# Patient Record
Sex: Male | Born: 1945
Health system: Southern US, Community
[De-identification: ages and names within clinical notes are randomized; demographics above are authoritative.]

## PROBLEM LIST (undated history)

## (undated) DIAGNOSIS — I2510 Atherosclerotic heart disease of native coronary artery without angina pectoris: Secondary | ICD-10-CM

## (undated) DIAGNOSIS — E785 Hyperlipidemia, unspecified: Secondary | ICD-10-CM

## (undated) DIAGNOSIS — I219 Acute myocardial infarction, unspecified: Secondary | ICD-10-CM

## (undated) DIAGNOSIS — R079 Chest pain, unspecified: Secondary | ICD-10-CM

## (undated) DIAGNOSIS — M1990 Unspecified osteoarthritis, unspecified site: Secondary | ICD-10-CM

## (undated) DIAGNOSIS — I10 Essential (primary) hypertension: Secondary | ICD-10-CM

## (undated) DIAGNOSIS — F17200 Nicotine dependence, unspecified, uncomplicated: Secondary | ICD-10-CM

## (undated) DIAGNOSIS — E119 Type 2 diabetes mellitus without complications: Secondary | ICD-10-CM

## (undated) HISTORY — DX: Chest pain, unspecified: R07.9

## (undated) HISTORY — DX: Nicotine dependence, unspecified, uncomplicated: F17.200

## (undated) HISTORY — PX: OTHER SURGICAL HISTORY: SHX169

## (undated) HISTORY — DX: Hyperlipidemia, unspecified: E78.5

## (undated) HISTORY — PX: CATARACT EXTRACTION: SUR2

## (undated) HISTORY — PX: HEMORROIDECTOMY: SUR656

## (undated) HISTORY — DX: Atherosclerotic heart disease of native coronary artery without angina pectoris: I25.10

## (undated) HISTORY — PX: CORONARY ANGIOPLASTY: SHX604

---

## 1997-11-16 ENCOUNTER — Ambulatory Visit (HOSPITAL_COMMUNITY): Admission: RE | Admit: 1997-11-16 | Discharge: 1997-11-16 | Payer: Self-pay | Admitting: Orthopedic Surgery

## 2001-04-08 ENCOUNTER — Ambulatory Visit (HOSPITAL_COMMUNITY): Admission: RE | Admit: 2001-04-08 | Discharge: 2001-04-08 | Payer: Self-pay | Admitting: Family Medicine

## 2001-04-08 ENCOUNTER — Encounter: Payer: Self-pay | Admitting: Family Medicine

## 2001-08-06 ENCOUNTER — Other Ambulatory Visit: Admission: RE | Admit: 2001-08-06 | Discharge: 2001-08-06 | Payer: Self-pay | Admitting: Dermatology

## 2002-11-12 ENCOUNTER — Encounter: Payer: Self-pay | Admitting: Family Medicine

## 2002-11-12 ENCOUNTER — Ambulatory Visit (HOSPITAL_COMMUNITY): Admission: RE | Admit: 2002-11-12 | Discharge: 2002-11-12 | Payer: Self-pay | Admitting: Family Medicine

## 2005-05-03 ENCOUNTER — Ambulatory Visit (HOSPITAL_COMMUNITY): Admission: RE | Admit: 2005-05-03 | Discharge: 2005-05-03 | Payer: Self-pay | Admitting: Family Medicine

## 2008-12-07 ENCOUNTER — Encounter: Payer: Self-pay | Admitting: Emergency Medicine

## 2008-12-07 ENCOUNTER — Inpatient Hospital Stay (HOSPITAL_COMMUNITY): Admission: EM | Admit: 2008-12-07 | Discharge: 2008-12-09 | Payer: Self-pay | Admitting: Cardiology

## 2008-12-07 ENCOUNTER — Ambulatory Visit: Payer: Self-pay | Admitting: Cardiology

## 2008-12-24 DIAGNOSIS — R079 Chest pain, unspecified: Secondary | ICD-10-CM

## 2008-12-24 DIAGNOSIS — F17200 Nicotine dependence, unspecified, uncomplicated: Secondary | ICD-10-CM

## 2008-12-24 DIAGNOSIS — E785 Hyperlipidemia, unspecified: Secondary | ICD-10-CM

## 2008-12-24 HISTORY — DX: Chest pain, unspecified: R07.9

## 2008-12-24 HISTORY — DX: Hyperlipidemia, unspecified: E78.5

## 2008-12-24 HISTORY — DX: Nicotine dependence, unspecified, uncomplicated: F17.200

## 2008-12-29 ENCOUNTER — Ambulatory Visit: Payer: Self-pay | Admitting: Cardiovascular Disease

## 2008-12-29 DIAGNOSIS — I2510 Atherosclerotic heart disease of native coronary artery without angina pectoris: Secondary | ICD-10-CM

## 2008-12-29 HISTORY — DX: Atherosclerotic heart disease of native coronary artery without angina pectoris: I25.10

## 2009-01-05 ENCOUNTER — Telehealth: Payer: Self-pay | Admitting: Cardiovascular Disease

## 2009-01-09 ENCOUNTER — Telehealth: Payer: Self-pay | Admitting: Cardiovascular Disease

## 2009-01-10 ENCOUNTER — Telehealth: Payer: Self-pay | Admitting: Cardiovascular Disease

## 2009-01-16 ENCOUNTER — Telehealth (INDEPENDENT_AMBULATORY_CARE_PROVIDER_SITE_OTHER): Payer: Self-pay | Admitting: *Deleted

## 2009-01-26 ENCOUNTER — Telehealth: Payer: Self-pay | Admitting: Cardiovascular Disease

## 2009-01-26 ENCOUNTER — Encounter: Payer: Self-pay | Admitting: Cardiovascular Disease

## 2009-03-23 ENCOUNTER — Ambulatory Visit: Payer: Self-pay | Admitting: Cardiovascular Disease

## 2009-03-23 ENCOUNTER — Telehealth: Payer: Self-pay | Admitting: Cardiovascular Disease

## 2009-05-17 ENCOUNTER — Encounter: Payer: Self-pay | Admitting: Cardiovascular Disease

## 2009-05-24 LAB — CONVERTED CEMR LAB
ALT: 20 units/L (ref 0–53)
AST: 17 units/L (ref 0–37)
Albumin: 4.3 g/dL (ref 3.5–5.2)
Alkaline Phosphatase: 51 units/L (ref 39–117)
Bilirubin, Direct: 0.1 mg/dL (ref 0.0–0.3)
Cholesterol: 106 mg/dL (ref 0–200)
HDL: 26 mg/dL — ABNORMAL LOW (ref >39)
Indirect Bilirubin: 0.4 mg/dL (ref 0.0–0.9)
LDL Cholesterol: 58 mg/dL (ref 0–99)
Total Bilirubin: 0.5 mg/dL (ref 0.3–1.2)
Total CHOL/HDL Ratio: 4.1
Total Protein: 6.8 g/dL (ref 6.0–8.3)
Triglycerides: 111 mg/dL (ref <150)
VLDL: 22 mg/dL (ref 0–40)

## 2009-08-08 ENCOUNTER — Telehealth: Payer: Self-pay | Admitting: Cardiovascular Disease

## 2009-09-06 ENCOUNTER — Telehealth: Payer: Self-pay | Admitting: Cardiovascular Disease

## 2009-09-26 ENCOUNTER — Ambulatory Visit: Payer: Self-pay | Admitting: Cardiovascular Disease

## 2009-10-10 ENCOUNTER — Telehealth: Payer: Self-pay | Admitting: Cardiovascular Disease

## 2009-10-17 ENCOUNTER — Ambulatory Visit (HOSPITAL_COMMUNITY): Admission: RE | Admit: 2009-10-17 | Discharge: 2009-10-17 | Payer: Self-pay | Admitting: Cardiovascular Disease

## 2009-10-17 ENCOUNTER — Ambulatory Visit: Payer: Self-pay | Admitting: Cardiology

## 2009-10-17 ENCOUNTER — Ambulatory Visit: Payer: Self-pay

## 2009-10-26 ENCOUNTER — Telehealth: Payer: Self-pay | Admitting: Cardiovascular Disease

## 2009-11-23 ENCOUNTER — Telehealth: Payer: Self-pay | Admitting: Cardiovascular Disease

## 2010-03-20 ENCOUNTER — Telehealth: Payer: Self-pay | Admitting: Cardiovascular Disease

## 2010-03-27 ENCOUNTER — Ambulatory Visit: Payer: Self-pay | Admitting: Cardiovascular Disease

## 2010-05-22 NOTE — Progress Notes (Signed)
Summary: muscle pain  Phone Note Call from Patient Call back at Home Phone [phone]   Caller: Patient Reason for Call: Talk to Nurse Summary of Call: c/o muscule pain, pt think it related to the meds. plavix, crestor,  Initial call taken by: Lorne Skeens,  Sep 06, 2009 2:16 PM  Follow-up for Phone Call        Pt. reports gradual increase of pain starting in back of neck and going down both arms to bicep area. Increased yesterday. Occasional right sided chest pain.  Pain relieved some with Advil. I told pt it was better to take tylenol instead of advil and he will try this. No SOB. Pt reports  these pains are completely different than pain prior to stent. Still has pain in right wrist/finger as noted in note from last office visit. Pt. wondering if this could be related to meds.  I told him it would not be related to Plavix but muscle aches could be related to Crestor. I instructed pt to stop Crestor until scheduled office visit with Dr.Zawadi Aplin on September 21, 2009.  He will call primary care MD if pain was to increase. Dossie Arbour, RN, BSN  Sep 06, 2009 2:48 PM   Additional Follow-up for Phone Call Additional follow up Details #1::        I agree. cdm Additional Follow-up by: Verne Carrow, MD,  Sep 06, 2009 4:09 PM

## 2010-05-22 NOTE — Progress Notes (Signed)
Summary: med question  Phone Note Call from Patient Call back at Home Phone [phone]   Caller: Patient Reason for Call: Talk to Nurse Summary of Call: request to speak to nurse about meds Initial call taken by: Migdalia Dk,  October 10, 2009 3:52 PM  Follow-up for Phone Call        [name] calls today still having neck and arm discomfort with the reduced dose of Crestor 10mg .  He is taking this at bedtime.  He has stopped the Crestor and his symptoms have subsided.  He would like to know what other medication should he be taking for his cholesterol?  I told him Dr. Sanjuana Kava was out of the office at this time and I would forward this message to him.  He will await callback.   Lisabeth Devoid RN     Appended Document: med question He can stop Crestor. For now, he should stay off of a statin. cdm  Appended Document: med question left message to call back.  Appended Document: med question Pt. notified.

## 2010-05-22 NOTE — Assessment & Plan Note (Signed)
Summary: 6 month follow up/414.01/pla   Visit Type:  6 months follow up Primary Provider:  Dr. Timoteo Expose  CC:  dizziness sometimes and  Right shoulder pain .  History of Present Illness: 65 yo WM with h/o CAD, hyperlipidemia, tobacco abuse with  admission to [facility] 12/08/08 with NSTEMI. He was found to have a severe stenosis of the mid Circumflex coronary artery. This was treated with a large bare metal stent. He had moderate residual disease in the mid RCA.  He has had no chest pressure. He was on Crestor 40 mg but had bilateral shoulder pain with radiation into right arm. He stopped the Crestor three weeks ago and the pain has mostly resolved. He does describe occasional sharp right sided chest pains that shoot into his right shoulder.Nothing like his cardiac pain.  No exertional chest pain. No SOB. He has completely stopped smoking 10 months ago but has started back smoking 1-2 cigarettes per week. Exercise tolerance has been good. He walks every other day. Energy level is down a little. He gets fatigued easily.   Current Medications (verified): 1)  Nitroglycerin 0.4 Mg Subl (Nitroglycerin) .... One Tablet Under Tongue Every 5 Minutes As Needed For Chest Pain---May Repeat Times Three 2)  Metoprolol Tartrate 25 Mg Tabs (Metoprolol Tartrate) .... 1/2  Tab Two Times A Day 3)  Plavix 75 Mg Tabs (Clopidogrel Bisulfate) .Marland Kitchen.. 1 Tab Once Daily 4)  Tylenol 325 Mg Tabs (Acetaminophen) .... As Needed 5)  Aspirin Ec 325 Mg Tbec (Aspirin) .... Take One Tablet By Mouth Daily 6)  Fish Oil 1000 Mg Caps (Omega-3 Fatty Acids) .Marland Kitchen.. 1 Cap Once Daily  Allergies (verified): No Known Drug Allergies  Past History:  Past Medical History: Reviewed history from 12/29/2008 and no changes required. CORONARY ARTERY DISEASE-s/p bare metal stent mid Circumflex 8/10 CHEST PAIN-UNSPECIFIED (ICD-786.50) DYSLIPIDEMIA (ICD-272.4) TOBACCO USER (ICD-305.1)    Social History: Reviewed history from 12/29/2008 and  no changes required.  Occasional alcohol.  No illicit drug use.  He works as   a Production designer, theatre/television/film for a [employer].  History of tobacco use since the   age of 16-stopped smoking 8/10.  Review of Systems       The patient complains of joint pain.  The patient denies fatigue, malaise, fever, weight gain/loss, vision loss, decreased hearing, hoarseness, chest pain, palpitations, shortness of breath, prolonged cough, wheezing, sleep apnea, coughing up blood, abdominal pain, blood in stool, nausea, vomiting, diarrhea, heartburn, incontinence, blood in urine, muscle weakness, leg swelling, rash, skin lesions, headache, fainting, dizziness, depression, anxiety, enlarged lymph nodes, easy bruising or bleeding, and environmental allergies.    Vital Signs:  Patient profile:   65 year old male Height:      69 inches Weight:      193.50 pounds BMI:     28.68 Pulse rate:   74 / minute Pulse rhythm:   regular Resp:     18 per minute BP sitting:   120 / 84  (left arm) Cuff size:   large  Vitals Entered By: Vikki Ports (September 26, 2009 11:37 AM)  Physical Exam  General:  General: Well developed, well nourished, NAD Neuro: No focal deficits Psychiatric: Mood and affect normal Neck: No JVD, no carotid bruits, no thyromegaly, no lymphadenopathy. Lungs:Clear bilaterally, no wheezes, rhonci, crackles CV: RRR no murmurs, gallops rubs Abdomen: soft, NT, ND, BS present Extremities: No edema, pulses 2+.    EKG  Procedure date:  09/26/2009  Findings:  NSR, rate 74 bpm. RBBB.   Impression & Recommendations:  Problem # 1:  CAD, NATIVE VESSEL (ICD-414.01) Stable. Continue ASA/:Plavix/ beta blocker.  He will need at least one year of dual antiplatelet therapy. I am not sure if his muscle aches are related to the statin. Will start Crestor at lower dose of 10 mg per day. Will check lipids, LFTs and CK in 2 weeks. Will check echo to assess LV function with recent fatigue and dyspnea.   His  updated medication list for this problem includes:    Nitroglycerin 0.4 Mg Subl (Nitroglycerin) ..... One tablet under tongue every 5 minutes as needed for chest pain---may repeat times three    Metoprolol Tartrate 25 Mg Tabs (Metoprolol tartrate) .Marland Kitchen... 1/2  tab two times a day    Plavix 75 Mg Tabs (Clopidogrel bisulfate) .Marland Kitchen... 1 tab once daily    Aspirin Ec 325 Mg Tbec (Aspirin) .Marland Kitchen... Take one tablet by mouth daily  Orders: EKG w/ Interpretation (93000) Echocardiogram (Echo)  His updated medication list for this problem includes:    Nitroglycerin 0.4 Mg Subl (Nitroglycerin) ..... One tablet under tongue every 5 minutes as needed for chest pain---may repeat times three    Metoprolol Tartrate 25 Mg Tabs (Metoprolol tartrate) .Marland Kitchen... 1/2  tab two times a day    Plavix 75 Mg Tabs (Clopidogrel bisulfate) .Marland Kitchen... 1 tab once daily    Aspirin Ec 325 Mg Tbec (Aspirin) .Marland Kitchen... Take one tablet by mouth daily  Problem # 2:  TOBACCO USER (ICD-305.1) Complete cessation advised.   Patient Instructions: 1)  Your physician recommends that you schedule a follow-up appointment in: 6 months 2)  Your physician has recommended you make the following change in your medication: Start Crestor 10 mg by mouth daily 3)  Have lab work (lipid profile, liver profile, total CK) done in 2 weeks. You have prescription for this. 4)  Your physician has requested that you have an echocardiogram.  Echocardiography is a painless test that uses sound waves to create images of your heart. It provides your doctor with information about the size and shape of your heart and how well your heart's chambers and valves are working.  This procedure takes approximately one hour. There are no restrictions for this procedure.

## 2010-05-22 NOTE — Progress Notes (Signed)
Summary: pt has to have dental work done  Phone Note Call from Patient Call back at [employer] [phone]   Caller: Patient Reason for Call: Talk to Nurse, Talk to Doctor Summary of Call: pt has to have dental work to get done and is on plavix and needs to know if he needs to come off or not Initial call taken by: Omer Jack,  October 26, 2009 10:36 AM  Follow-up for Phone Call        Spoke with pt. who thinks he may need to have 2-3 teeth pulled. Has not seen dentist yet but is planning on going to free dental clinic in [place] that is planned for tomorrow and Saturday.  He is asking what to do about Plavix.  Will discuss with Dr. Clifton James Follow-up by: Dossie Arbour, RN, BSN,  October 26, 2009 10:59 AM  Additional Follow-up for Phone Call Additional follow up Details #1::        He needs Plavix until the end of August. I would not recommend stopping for elective dental work until then. cdm Additional Follow-up by: Verne Carrow, MD,  October 26, 2009 1:17 PM    Additional Follow-up for Phone Call Additional follow up Details #2::    Pt. given above information from Dr. Clifton James. Pt planning on going to dental clinic to be evaluated and see if tooth removal could be planned for a later date if needed. Follow-up by: Dossie Arbour, RN, BSN,  October 26, 2009 1:52 PM

## 2010-05-22 NOTE — Assessment & Plan Note (Signed)
Summary: per check out/sf   Visit Type:  6 MO F/U Primary Travez Stancil:  Dr. Regino Schultze  CC:  sob only when he has indigestion....says he has chest discomfort only when he has the indigsestion and he says he knows this is indigestion because he says it backs up in his esophagaus....denies an edema.  History of Present Illness: 65 yo WM with h/o CAD, hyperlipidemia, tobacco abuse with admission to [facility] 12/08/08 with NSTEMI. He was found to have a severe stenosis of the mid Circumflex coronary artery. This was treated with a large bare metal stent. He had moderate residual disease in the mid RCA.  He has had no chest pressure. He was on Crestor 40 mg but had bilateral shoulder pain with radiation into right arm. He stopped the Crestor prior to the last visit  and the pain has mostly resolved. Most recent echo in June 2011 with preserved LV function.   He has been doing well. We stopped his Plavix last week. He describes burning in his chest after meals which is c/w GERD. This has been better since he stopped his Plavix. No SOB or palpitations. No dizziness or near syncope. He has unfortunately started back smoking 2-3 cigarettes per week. He has been hunting or working without any problems. He does describe feeling more fatigued at the end of the day than he did in prior to his MI.   Current Medications (verified): 1)  Nitroglycerin 0.4 Mg Subl (Nitroglycerin) .... One Tablet Under Tongue Every 5 Minutes As Needed For Chest Pain---May Repeat Times Three 2)  Metoprolol Tartrate 25 Mg Tabs (Metoprolol Tartrate) .... 1/2  Tab Two Times A Day 3)  Tylenol 325 Mg Tabs (Acetaminophen) .... As Needed 4)  Fish Oil 1000 Mg Caps (Omega-3 Fatty Acids) .Marland Kitchen.. 1 Cap Once Daily  Allergies (verified): No Known Drug Allergies  Past History:  Past Medical History: Reviewed history from 12/29/2008 and no changes required. CORONARY ARTERY DISEASE-s/p bare metal stent mid Circumflex 8/10 CHEST  PAIN-UNSPECIFIED (ICD-786.50) DYSLIPIDEMIA (ICD-272.4) TOBACCO USER (ICD-305.1)    Social History: Reviewed history from 12/29/2008 and no changes required.  Occasional alcohol.  No illicit drug use.  He works as   a Production designer, theatre/television/film for a Database administrator.  History of tobacco use since the  age of 16-now 2-3 cigarettes per week.  he enjoyes hunting and playing guitar in a band  Review of Systems  The patient denies fatigue, malaise, fever, weight gain/loss, vision loss, decreased hearing, hoarseness, chest pain, palpitations, shortness of breath, prolonged cough, wheezing, sleep apnea, coughing up blood, abdominal pain, blood in stool, nausea, vomiting, diarrhea, heartburn, incontinence, blood in urine, muscle weakness, joint pain, leg swelling, rash, skin lesions, headache, fainting, dizziness, depression, anxiety, enlarged lymph nodes, easy bruising or bleeding, and environmental allergies.         Heartburn.  Vital Signs:  Patient profile:   65 year old male Height:      69 inches Weight:      194.50 pounds BMI:     28.83 Pulse rate:   78 / minute Pulse rhythm:   regular BP sitting:   130 / 88  (left arm) Cuff size:   large  Vitals Entered By: Danielle Rankin, CMA (March 27, 2010 11:23 AM)  Physical Exam  General:  General: Well developed, well nourished, NAD Musculoskeletal: Muscle strength 5/5 all ext Psychiatric: Mood and affect normal Neck: No JVD, no carotid bruits, no thyromegaly, no lymphadenopathy. Lungs:Clear bilaterally, no wheezes, rhonci,  crackles CV: RRR no murmurs, gallops rubs Abdomen: soft, NT, ND, BS present Extremities: No edema, pulses 2+.    Echocardiogram  Procedure date:  04/18/2010  Findings:      Left ventricle: The cavity size was normal. Wall thickness was     normal. The estimated ejection fraction was 60%. Wall motion was     normal; there were no regional wall motion abnormalities.  Impression & Recommendations:  Problem # 1:  CAD,  NATIVE VESSEL (ICD-414.01) Stable. Will have him restart ASA 81 mg by mouth Qdaily. He stopped this by mistake. He will stay off of Plavix. Continue Lopressor. Restart statin (see below).  The following medications were removed from the medication list:    Plavix 75 Mg Tabs (Clopidogrel bisulfate) .Marland Kitchen... 1 tab once daily    Aspirin Ec 325 Mg Tbec (Aspirin) .Marland Kitchen... Take one tablet by mouth daily His updated medication list for this problem includes:    Nitroglycerin 0.4 Mg Subl (Nitroglycerin) ..... One tablet under tongue every 5 minutes as needed for chest pain---may repeat times three    Metoprolol Tartrate 25 Mg Tabs (Metoprolol tartrate) .Marland Kitchen... 1/2  tab two times a day    Aspirin 81 Mg Tbec (Aspirin) .Marland Kitchen... Take one tablet by mouth daily  Problem # 2:  DYSLIPIDEMIA (ICD-272.4) He has been off of a statin because of myalgias. He is willing to restart his Crestor today. Will start Crestor 5 mg by mouth Qdaily. Repeat lipids in 12 weeks.   The following medications were removed from the medication list:    Crestor 10 Mg Tabs (Rosuvastatin calcium) .Marland Kitchen... Take one tablet by mouth daily. His updated medication list for this problem includes:    Crestor 5 Mg Tabs (Rosuvastatin calcium) .Marland Kitchen... Take one tablet by mouth daily.  Patient Instructions: 1)  Your physician recommends that you schedule a follow-up appointment in: 6months 2)  Your physician has recommended you make the following change in your medication: START Crestor 5mg  by mouth daily and Aspirin 81mg  by mouth daily.

## 2010-05-22 NOTE — Progress Notes (Signed)
Summary: pt wants to talk to nurse  Phone Note Call from Patient   Caller: Patient [phone] Reason for Call: Talk to Nurse Summary of Call: pt calling for refill of plavix, told him I could do a refill request, but he wants to talk to nurse -pls call [phone] Initial call taken by: Glynda Jaeger,  November 23, 2009 10:14 AM  Follow-up for Phone Call        Spoke with pt. He needs Plavix refilled. I called [facility] and refilled Plavix.  New assistance request will need to be sent in with proof of income after this shipment. Can be requested after January 23, 2010. I let pt know this and will leave application form with Plavix when it arrives. Pt aware we will call him when Plavix arrives in office Dossie Arbour, RN, BSN  November 23, 2009 12:43 PM   Additional Follow-up for Phone Call Additional follow up Details #1::        pt aware plavix received from [facility] and at the front desk for pick up Deliah Goody, RN  November 28, 2009 12:42 PM

## 2010-05-22 NOTE — Progress Notes (Signed)
Summary: Pt request call  Phone Note Call from Patient Call back at Home Phone [phone]   Caller: Patient Reason for Call: Talk to Nurse Initial call taken by: Judie Grieve,  March 20, 2010 1:10 PM  Follow-up for Phone Call        Pt. was wondering if he needed to stay on his Plavix since he has been on it for over a year. Spoke with Dr. Clifton James & since the pt.  is having trouble affording the medication, he can stop his plavix. He will f/u with Dr. Clifton James 12/6  Whitney P Clifton Custard RN  March 20, 2010 2:19 PM  Follow-up by: Whitney Maeola Sarah RN,  March 20, 2010 2:19 PM

## 2010-05-22 NOTE — Progress Notes (Signed)
Summary: NEED SAMPLES OF PLAVIX AND NEED PLAVIX ORDERED  Phone Note Refill Request Call back at Home Phone [phone] Call back at [phone] Message from:  Patient on August 08, 2009 10:07 AM  Refills Requested: Medication #1:  PLAVIX 75 MG TABS 1 tab once daily PT NEED HIS PLAVIX ORDERED ONLY HAVE FOR PILLS LEFT NEED SAMPLES TO HIS PRESCRIPTION GETS IN  Initial call taken by: Judie Grieve,  August 08, 2009 10:09 AM Caller: Patient  Follow-up for Phone Call        CMA s/w pt to verify [facility] for plavix rx.Taim Boudoin verfied [employer] [facility]..Pt also asked for samples of plavix since all he had left were 4 pills, CMA advised pt that samples would be at front desk for him to pick up. CMA then called [facility] and ordered Plavix today. Danielle Rankin, CMA  August 08, 2009 10:28 AM      Appended Document: NEED SAMPLES OF PLAVIX AND NEED PLAVIX ORDERED SPOKE WITH PT, HE IS AWARE THE PLAVIX FROM [facility] IS AT THE FRONT DESK FOR PICK UP

## 2010-07-28 LAB — BASIC METABOLIC PANEL
BUN: 11 mg/dL (ref 6–23)
BUN: 9 mg/dL (ref 6–23)
CO2: 27 mEq/L (ref 19–32)
CO2: 29 mEq/L (ref 19–32)
Calcium: 9 mg/dL (ref 8.4–10.5)
Calcium: 9.3 mg/dL (ref 8.4–10.5)
Chloride: 106 mEq/L (ref 96–112)
Chloride: 107 mEq/L (ref 96–112)
Chloride: 107 mEq/L (ref 96–112)
Creatinine, Ser: 0.81 mg/dL (ref 0.4–1.5)
GFR calc Af Amer: >60 mL/min (ref >60)
GFR calc non Af Amer: >60 mL/min (ref >60)
Glucose, Bld: 100 mg/dL — ABNORMAL HIGH (ref 70–99)
Glucose, Bld: 102 mg/dL — ABNORMAL HIGH (ref 70–99)
Glucose, Bld: 98 mg/dL (ref 70–99)
Potassium: 3.6 mEq/L (ref 3.5–5.1)
Potassium: 3.9 mEq/L (ref 3.5–5.1)
Potassium: 3.9 mEq/L (ref 3.5–5.1)
Sodium: 139 mEq/L (ref 135–145)
Sodium: 140 mEq/L (ref 135–145)

## 2010-07-28 LAB — DIFFERENTIAL
Basophils Absolute: 0.1 10*3/uL (ref 0.0–0.1)
Basophils Relative: 1 % (ref 0–1)
Eosinophils Relative: 3 % (ref 0–5)
Eosinophils Relative: 5 % (ref 0–5)
Lymphocytes Relative: 21 % (ref 12–46)
Lymphocytes Relative: 31 % (ref 12–46)
Lymphs Abs: 2.4 10*3/uL (ref 0.7–4.0)
Monocytes Absolute: 0.5 10*3/uL (ref 0.1–1.0)
Monocytes Absolute: 0.6 10*3/uL (ref 0.1–1.0)
Monocytes Relative: 6 % (ref 3–12)
Monocytes Relative: 6 % (ref 3–12)

## 2010-07-28 LAB — POCT CARDIAC MARKERS
Myoglobin, poc: 240 ng/mL (ref 12–200)
Troponin i, poc: 0.09 ng/mL (ref 0.00–0.09)

## 2010-07-28 LAB — APTT: aPTT: 26 seconds (ref 24–37)

## 2010-07-28 LAB — CBC
HCT: 43.7 % (ref 39.0–52.0)
HCT: 44.5 % (ref 39.0–52.0)
HCT: 46.2 % (ref 39.0–52.0)
Hemoglobin: 15 g/dL (ref 13.0–17.0)
Hemoglobin: 15.4 g/dL (ref 13.0–17.0)
Hemoglobin: 15.7 g/dL (ref 13.0–17.0)
MCHC: 34 g/dL (ref 30.0–36.0)
MCHC: 34.4 g/dL (ref 30.0–36.0)
MCHC: 34.7 g/dL (ref 30.0–36.0)
MCV: 91.9 fL (ref 78.0–100.0)
MCV: 92.6 fL (ref 78.0–100.0)
Platelets: 207 10*3/uL (ref 150–400)
RBC: 4.8 MIL/uL (ref 4.22–5.81)
RBC: 5.02 MIL/uL (ref 4.22–5.81)
RDW: 13 % (ref 11.5–15.5)
RDW: 13.2 % (ref 11.5–15.5)
RDW: 13.3 % (ref 11.5–15.5)
WBC: 8 10*3/uL (ref 4.0–10.5)

## 2010-07-28 LAB — CARDIAC PANEL(CRET KIN+CKTOT+MB+TROPI)
CK, MB: 21.2 ng/mL — ABNORMAL HIGH (ref 0.3–4.0)
Relative Index: 12.6 — ABNORMAL HIGH (ref 0.0–2.5)

## 2010-07-28 LAB — LIPID PANEL
HDL: 24 mg/dL — ABNORMAL LOW (ref >39)
LDL Cholesterol: 140 mg/dL — ABNORMAL HIGH (ref 0–99)
Total CHOL/HDL Ratio: 7.5 RATIO
VLDL: 17 mg/dL (ref 0–40)

## 2010-07-28 LAB — TROPONIN I: Troponin I: 0.27 ng/mL — ABNORMAL HIGH (ref 0.00–0.06)

## 2010-07-28 LAB — CK TOTAL AND CKMB (NOT AT ARMC): CK, MB: 3.5 ng/mL (ref 0.3–4.0)

## 2010-09-04 NOTE — Discharge Summary (Signed)
NAMELADAINIAN, THERIEN NO.:  [id number]   MEDICAL RECORD NO.:  [id number]          PATIENT TYPE:  INP   LOCATION:  2503                         FACILITY:  [facility]   PHYSICIAN:  Verne Carrow, MDDATE OF BIRTH:  April 05, 1946   DATE OF ADMISSION:  12/07/2008  DATE OF DISCHARGE:  12/09/2008                               DISCHARGE SUMMARY   PROCEDURES:  1. Cardiac catheterization.  2. Coronary arteriogram.  3. Left ventriculogram.  4. Percutaneous transluminal coronary angioplasty with thrombectomy      and bare-metal stent.   PRIMARY FINAL DISCHARGE DIAGNOSIS:  Non-ST segment elevation myocardial  infarction.   SECONDARY DIAGNOSES:  1. Ongoing tobacco use.  2. Family history of coronary artery disease, prematurely in his      father.  3. Dyslipidemia with a total cholesterol of 181, triglycerides 86, HDL      24, LDL 140 this admission.   TIME AT DISCHARGE:  43 minutes.   HOSPITAL COURSE:  Mr. Mimbs is a 65 year old male with a previous  history of coronary artery disease.  He had repeated episodes of chest  pain and on the day of admission, it was as it radiated to his jaw and  was associated with diaphoresis.  He went to [facility] where  he was stabilized with nitroglycerin, morphine, and heparin.  He was  transferred to [facility] for further evaluation and treatment.   His cardiac enzymes elevated indicating a non-ST segment elevation MI.  His peak CK-MB was 288/36.3 with a troponin I peak of 3.87.  He was  taken to the cath lab on December 08, 2008.  The left main and LAD had no  disease.  The RCA had a 60% stenosis.  It is a dominant vessel.  The  circumflex had a 99% haziness in the midportion with significant  thrombus burden.  This was treated with PTCA and thrombectomy as well as  placement of a bare-metal stent.  His EF was 55%.   Mr. Bera was seen by smoking cessation and cardiac rehab.  On December 09, 2008, he was  ambulating without chest pain or shortness of breath.  He was motivated to quit smoking in this was encouraged.  He was seen by  Dr. Clifton James and evaluated.  Dr. Clifton James felt that Mr. Uhde was stable  for discharge with close outpatient followup.   DISCHARGE INSTRUCTIONS:  1. His activity level is to be increased gradually with no driving for      4 days and no lifting for 3 weeks.  2. He is to call our office for problems with the cath site.  3. He is to follow up with Dr. Clifton James on September 9 at 4:15 and      with Dr. Regino Schultze as needed.  4. He is encouraged to stick to a low-sodium heart-healthy diet.   DISCHARGE MEDICATIONS:  Per the computerized med list.      Theodore Demark, PA-C      Verne Carrow, MD  Electronically Signed    RB/MEDQ  D:  12/09/2008  T:  12/09/2008  Job:  [id number]   cc:   Kirk Ruths, M.D.

## 2010-09-04 NOTE — H&P (Signed)
[name], [name] NO.:  [id number]   MEDICAL RECORD NO.:  [id number]          PATIENT TYPE:  INP   LOCATION:  2901                         FACILITY:  [facility]   PHYSICIAN:  Darryl D. Prime, MD    DATE OF BIRTH:  [birth year]   DATE OF ADMISSION:  12/07/2008  DATE OF DISCHARGE:                              HISTORY & PHYSICAL   The patient is a full code.   Primary care physician is Dr. Regino Schultze, [facility].  He has no  cardiologist.   CHIEF COMPLAINT:  Chest pain, chest pressure.   HISTORY OF PRESENT ILLNESS:  [name] is a 65 year old male with no his  history of coronary artery disease.  He notes chest pain last week,  Wednesday, that was fleeting.  This is 7 days ago, brief, at rest, and  then on Saturday night, which is approximately 5 days prior to  admission, during dinner he had a sudden onset of chest pain, chest  pressure radiating to the neck, anterior chest discomfort.  Lasted 1  hour and 15 minutes.  Took nothing for it except antacids, as he thought  it was reflux.  The patient had sweats with it.  No shortness of breath.  No nausea.  He did have some associated headaches with it.  The patient  notes he did well until 5:15 p.m. 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  A sudden onset of chest discomfort, rated  07/10, anterior chest bilaterally with radiation to the jaws  bilaterally.  Severe jaw pain associated with sweats.  Again, no  shortness of breath or nausea.  The patient did take 2 aspirins, full-  dose, which relieved the pain to about 4/10.  His girlfriend is a Engineer, civil (consulting)  at [employer], and she drove him to the emergency room.  The pain lasted  for a total 2 hours after, but it resolved after given 4000 units of  heparin in a drip, morphine 2 mg IV, and nitroglycerin drip.  The  patient denies any fever, cough, black stools, bloody stools, dysuria,  hematuria.   PAST MEDICAL HISTORY:  He has never had a cardiac  catheterization or a  stress test.  He did have surgery on his middle finger on the right  after trauma.  This was in 2002.   ALLERGIES:  NO KNOWN DRUG ALLERGIES.   MEDICATIONS:  He is on aspirin 81 mg daily.  Fish oral daily.   SOCIAL HISTORY:  Occasional alcohol.  No illicit drug use.  He works as  a Production designer, theatre/television/film for a [employer].  History of tobacco use since the  age of 50, one pack per day and current.   Family history is positive for massive myocardial infarction in the  father at the age of 79.  He did die due to complications of that MI.   REVIEW OF SYSTEMS:  A 14 point review of systems negative unless stated  above.   PHYSICAL EXAMINATION:  VITAL SIGNS: Temperature is 98.2 with a pulse of  83, respiratory of 14, blood  pressure 138/83.  Sats are 100% on 2 liters  nasal cannula.  GENERAL:  The patient is a male who looks his stated age, sitting  upright in bed in no acute distress.  HEENT: Normocephalic, atraumatic.  Pupils equal round reactive light.  Extraocular being intact.  Oropharynx reveals no posterior pharyngeal  lesions.  NECK:  Supple with no evidence of lymphadenopathy or thyromegaly.  No  carotid bruits.  No jugular venous distention.  SKIN:  Shows no rashes  or ulcers.  MUSCULOSKELETAL:  He moves all his extremities well with no signs of  effusions or joint deformities.  LUNGS: Clear to auscultation bilaterally with no wheezing or rubs.  CARDIOVASCULAR:  Regular rhythm and rate with no murmurs, rubs, gallops.  Normal S1-S2.  No S3 or S4.  ABDOMEN:  Soft, nontender, nondistended, no hepatosplenomegaly.  EXTREMITIES:  Show no clubbing, cyanosis or edema.  VASCULAR:  Reveals 2+ pulses, dorsalis pedis, brachial, radial, femoral  and carotids.  They are symmetric.  PSYCHIATRIC:  Appropriate mood and affect.   The patient's chest x-ray on studies show no acute cardiopulmonary  disease.  No prior chest x-ray available.   EKG:  The first EKG was at 1809,  and it was normal sinus rhythm at 90  beats per minute.  The P-R interval was 174, QRS 142, QT corrected at  491.  Right bundle branch block with inferior lateral ST depressions,  more pronounced than would be accounted for by the right bundle branch  block alone.  The ST depressions inferolaterally did resolve somewhat  after medications given at [facility] ED on the second EKG.   Labs showed a white count of 8, hemoglobin 15.7, hematocrit 46.2,  platelets 207, segs of 59 and 531.  His sodium is 139 with a potassium  of 3.6, chloride 106, bicarb 29, BUN 90, creatinine 0.9, glucose 100,  calcium nine 0.3.  Cardiac markers at 1813 showed a troponin of 0.27, CK-  MB 3.5, CK 93.  At 1954, CK-MB was 2.9, troponin 0.09, but this is a  point of care lab.  INR of 1.0, PT 12.7, PTT 26.   ASSESSMENT/PLAN:  This is a patient with a history of risk factors, as  above.  He has been having angina, progressive, mostly at rest.  He  presented with EKG changes and a troponin elevation.  A constellation of  symptoms and signs worrisome for acute coronary syndrome, rule out PTT.  At this time, we will admit him to the CCU and will continue the heparin  drip and aspirin.  We will check lipids and place him high-dose statin.  We will also give low-dose beta blockade as an anti-ischemic drug.  We  will hold him n.p.o. and pending for possible ischemic evaluation in the  morning.  DVT and GI prophylaxis will be ordered.  Will counsel  concerning his discontinuation of tobacco products, specifically  cigarettes, and will check a D-dimer to rule out DVT, PTT.      Darryl D. Prime, MD  Electronically Signed     DDP/MEDQ  D:  12/07/2008  T:  12/08/2008  Job:  [id number]

## 2010-09-04 NOTE — Cardiovascular Report (Signed)
[name], [name] NO.:  [id number]   MEDICAL RECORD NO.:  [id number]          PATIENT TYPE:  INP   LOCATION:  2503                         FACILITY:  [facility]   PHYSICIAN:  Verne Carrow, MDDATE OF BIRTH:  October 27, 1945   DATE OF PROCEDURE:  12/08/2008  DATE OF DISCHARGE:                            CARDIAC CATHETERIZATION   PROCEDURES PERFORMED:  1. Left heart catheterization.  2. Selective coronary angiography.  3. Left ventricular angiogram.  4. Percutaneous coronary intervention with thrombectomy of the mid      circumflex artery and placement of a bare-metal stent in the mid      circumflex coronary artery.  5. Placement of an Angio-Seal femoral artery closure device.   OPERATOR:  Verne Carrow, MD   INDICATIONS:  Non-ST-elevation myocardial infarction in a 65 year old  patient with no prior heart disease who does have a history of tobacco  use and a family history of coronary artery disease.   DETAILS OF PROCEDURE:  The patient was brought to the main cardiac  catheterization laboratory after signing informed consent for the  procedure.  The right groin was prepped and draped in a sterile fashion.  Lidocaine 1% was used for local anesthesia.  A 5-French sheath was  inserted into the right femoral artery without difficulty.  Standard  diagnostic catheters were used to perform selective coronary  angiography.  A pigtail catheter was used to perform a left ventricular  angiogram.   At this point of procedure, we elected to proceed intervention of the  99% hazy stenosis in the mid circumflex coronary artery.  The patient  was given a bolus of Angiomax and a drip was started.  He was given 600  mg of Plavix on the cath table.  This sheath was upsized to a 6-French  sheath.  XB 3.5 guiding catheter was used to selectively engage the left  main coronary artery.  When the ACT was greater than 200, I passed a  cougar intracoronary wire down the  length of the circumflex artery and  beyond the area of tightest stenosis.  A Fetch thrombectomy catheter was  then passed over the wire and through the area of thrombus.  Two runs  were made with Fetch catheter and a significant thrombus burden was  removed with this catheter.  After the Fetch catheter was placed in the  vessel, there was a significant removal of thrombus burden  angiographically.  At this point, a 3.0 x 12-mm balloon was inflated in  the area of tightest stenosis.  This balloon was removed and a 4.0 x 15-  mm Driver bare metal stent was placed in the middle portion of the  circumflex artery in the area of tightest stenosis.  A 4.5 x 12-mm  noncompliant balloon was inflated inside the stent.  There was an  excellent angiographic result.  The stenosis prior to the intervention  was 99%, following the intervention was 0%.  The patient tolerated  procedure well and was taken to the holding area in stable condition.   HEMODYNAMIC FINDINGS:  Central aortic pressure 119/75.  Left  ventricular  pressure 105/9.  Left ventricular end-diastolic pressure 15.   ANGIOGRAPHIC FINDINGS:  1. The left main coronary artery had no significant disease.  2. Left anterior descending is a moderate-sized vessel that courses to      the apex.  It gives off 2 small diagonal branches.  There is no      obstructive disease noted in the system.  3. Circumflex artery is a large vessel that gives off 2 large obtuse      marginal branches.  There is a 99% hazy stenosis in the mid      circumflex just prior to the takeoff of the first obtuse marginal.      There appears to be significant thrombus burden in this portion of      the vessel.  There was TIMI III flow down the vessel that during      the diagnostic procedure.  There was no obstructive disease noted      in the obtuse marginal branch.  4. The right coronary artery had a 60% mid stenosis.  This was a large      dominant vessel.  Just prior  to the 60% stenosis was a long tubular      40% stenosis in the midportion of the vessel.  5. Left ventricular angiogram was performed in the RAO projection, it      shows normal left ventricular systolic function with an ejection      fraction of 55%.   IMPRESSION:  1. Successful percutaneous coronary intervention with placement of a      Driver bare-metal stent in the mid circumflex coronary artery.  2. Double-vessel coronary artery disease.  3. Normal left ventricular systolic function.   RECOMMENDATIONS:  The patient will be continued on aspirin, Plavix, beta-  blocker, and statin therapy.  He will need at least 4 weeks of Plavix  therapy, but I will continue this for 1 year if he tolerates therapy.      Verne Carrow, MD  Electronically Signed     CM/MEDQ  D:  12/08/2008  T:  12/09/2008  Job:  [id number]

## 2012-01-02 ENCOUNTER — Other Ambulatory Visit (HOSPITAL_COMMUNITY): Payer: Self-pay | Admitting: Physician Assistant

## 2012-01-02 DIAGNOSIS — I2510 Atherosclerotic heart disease of native coronary artery without angina pectoris: Secondary | ICD-10-CM

## 2012-01-07 ENCOUNTER — Ambulatory Visit (HOSPITAL_COMMUNITY)
Admission: RE | Admit: 2012-01-07 | Discharge: 2012-01-07 | Disposition: A | Discharge status: Home / Self Care | Payer: Self-pay | Source: Ambulatory Visit | Attending: Physician Assistant | Admitting: Physician Assistant

## 2012-01-07 DIAGNOSIS — I10 Essential (primary) hypertension: Secondary | ICD-10-CM | POA: Insufficient documentation

## 2012-01-07 DIAGNOSIS — I2510 Atherosclerotic heart disease of native coronary artery without angina pectoris: Secondary | ICD-10-CM | POA: Insufficient documentation

## 2012-01-07 DIAGNOSIS — R42 Dizziness and giddiness: Secondary | ICD-10-CM | POA: Insufficient documentation

## 2012-08-01 ENCOUNTER — Emergency Department (HOSPITAL_COMMUNITY): Payer: Self-pay

## 2012-08-01 ENCOUNTER — Emergency Department (HOSPITAL_COMMUNITY)
Admission: EM | Admit: 2012-08-01 | Discharge: 2012-08-02 | Disposition: A | Discharge status: Home Health | Payer: Self-pay | Attending: Emergency Medicine | Admitting: Emergency Medicine

## 2012-08-01 ENCOUNTER — Encounter (HOSPITAL_COMMUNITY): Payer: Self-pay | Admitting: *Deleted

## 2012-08-01 DIAGNOSIS — Z79899 Other long term (current) drug therapy: Secondary | ICD-10-CM | POA: Insufficient documentation

## 2012-08-01 DIAGNOSIS — I77819 Aortic ectasia, unspecified site: Secondary | ICD-10-CM | POA: Insufficient documentation

## 2012-08-01 DIAGNOSIS — S329XXA Fracture of unspecified parts of lumbosacral spine and pelvis, initial encounter for closed fracture: Secondary | ICD-10-CM

## 2012-08-01 DIAGNOSIS — I252 Old myocardial infarction: Secondary | ICD-10-CM | POA: Insufficient documentation

## 2012-08-01 DIAGNOSIS — I10 Essential (primary) hypertension: Secondary | ICD-10-CM | POA: Insufficient documentation

## 2012-08-01 DIAGNOSIS — Z7982 Long term (current) use of aspirin: Secondary | ICD-10-CM | POA: Insufficient documentation

## 2012-08-01 DIAGNOSIS — Y92410 Unspecified street and highway as the place of occurrence of the external cause: Secondary | ICD-10-CM | POA: Insufficient documentation

## 2012-08-01 DIAGNOSIS — E785 Hyperlipidemia, unspecified: Secondary | ICD-10-CM | POA: Insufficient documentation

## 2012-08-01 DIAGNOSIS — R58 Hemorrhage, not elsewhere classified: Secondary | ICD-10-CM | POA: Insufficient documentation

## 2012-08-01 DIAGNOSIS — E119 Type 2 diabetes mellitus without complications: Secondary | ICD-10-CM | POA: Insufficient documentation

## 2012-08-01 DIAGNOSIS — Y9389 Activity, other specified: Secondary | ICD-10-CM | POA: Insufficient documentation

## 2012-08-01 DIAGNOSIS — F17200 Nicotine dependence, unspecified, uncomplicated: Secondary | ICD-10-CM | POA: Insufficient documentation

## 2012-08-01 DIAGNOSIS — S8000XA Contusion of unspecified knee, initial encounter: Secondary | ICD-10-CM | POA: Insufficient documentation

## 2012-08-01 DIAGNOSIS — R0602 Shortness of breath: Secondary | ICD-10-CM | POA: Insufficient documentation

## 2012-08-01 DIAGNOSIS — S32509A Unspecified fracture of unspecified pubis, initial encounter for closed fracture: Secondary | ICD-10-CM | POA: Insufficient documentation

## 2012-08-01 DIAGNOSIS — S3981XA Other specified injuries of abdomen, initial encounter: Secondary | ICD-10-CM | POA: Insufficient documentation

## 2012-08-01 HISTORY — DX: Essential (primary) hypertension: I10

## 2012-08-01 HISTORY — DX: Type 2 diabetes mellitus without complications: E11.9

## 2012-08-01 HISTORY — DX: Hyperlipidemia, unspecified: E78.5

## 2012-08-01 HISTORY — DX: Acute myocardial infarction, unspecified: I21.9

## 2012-08-01 LAB — CBC WITH DIFFERENTIAL/PLATELET
Basophils Relative: 0 % (ref 0–1)
Eosinophils Absolute: 0.2 10*3/uL (ref 0.0–0.7)
Eosinophils Relative: 2 % (ref 0–5)
MCH: 31.1 pg (ref 26.0–34.0)
MCHC: 34.5 g/dL (ref 30.0–36.0)
MCV: 90.3 fL (ref 78.0–100.0)
Neutrophils Relative %: 79 % — ABNORMAL HIGH (ref 43–77)
Platelets: 173 10*3/uL (ref 150–400)
RDW: 13.1 % (ref 11.5–15.5)

## 2012-08-01 LAB — BASIC METABOLIC PANEL
Calcium: 9.1 mg/dL (ref 8.4–10.5)
GFR calc Af Amer: >90 mL/min (ref >90)
GFR calc non Af Amer: >90 mL/min (ref >90)
Glucose, Bld: 97 mg/dL (ref 70–99)
Potassium: 3.7 mEq/L (ref 3.5–5.1)
Sodium: 133 mEq/L — ABNORMAL LOW (ref 135–145)

## 2012-08-01 MED ORDER — IOHEXOL 300 MG/ML  SOLN
100.0000 mL | Freq: Once | INTRAMUSCULAR | Status: AC | PRN
  Administered 2012-08-01: 100 mL via INTRAVENOUS

## 2012-08-01 MED ORDER — SODIUM CHLORIDE 0.9 % IV BOLUS (SEPSIS)
250.0000 mL | Freq: Once | INTRAVENOUS | Status: AC
  Administered 2012-08-01: 250 mL via INTRAVENOUS

## 2012-08-01 MED ORDER — ONDANSETRON HCL 4 MG/2ML IJ SOLN
4.0000 mg | Freq: Once | INTRAMUSCULAR | Status: AC
Start: 2012-08-01 — End: 2012-08-01
  Administered 2012-08-01: 4 mg via INTRAVENOUS
  Filled 2012-08-01: qty 2

## 2012-08-01 MED ORDER — SODIUM CHLORIDE 0.9 % IV SOLN
INTRAVENOUS | Status: DC
  Administered 2012-08-01: 21:00:00 via INTRAVENOUS

## 2012-08-01 MED ORDER — HYDROMORPHONE HCL PF 1 MG/ML IJ SOLN
1.0000 mg | Freq: Once | INTRAMUSCULAR | Status: AC
  Administered 2012-08-01: 1 mg via INTRAVENOUS
  Filled 2012-08-01: qty 1
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
## 2012-08-02 LAB — CBC
MCH: 31.1 pg (ref 26.0–34.0)
MCHC: 35.2 g/dL (ref 30.0–36.0)
MCV: 88.5 fL (ref 78.0–100.0)
Platelets: 147 10*3/uL — ABNORMAL LOW (ref 150–400)
RBC: 4.69 MIL/uL (ref 4.22–5.81)
RDW: 13.5 % (ref 11.5–15.5)

## 2012-08-02 LAB — HEMOGLOBIN AND HEMATOCRIT, BLOOD: Hemoglobin: 14.7 g/dL (ref 13.0–17.0)

## 2012-08-02 MED ORDER — HYDROMORPHONE HCL PF 1 MG/ML IJ SOLN
1.0000 mg | Freq: Once | INTRAMUSCULAR | Status: AC
  Administered 2012-08-02: 1 mg via INTRAVENOUS
  Filled 2012-08-02: qty 1

## 2012-08-02 MED ORDER — OXYCODONE-ACETAMINOPHEN 5-325 MG PO TABS
2.0000 | ORAL_TABLET | Freq: Once | ORAL | Status: AC
  Administered 2012-08-02: 2 via ORAL
  Filled 2012-08-02: qty 2

## 2012-08-02 MED ORDER — HYDROCODONE-ACETAMINOPHEN 5-325 MG PO TABS: 1.0000 | ORAL_TABLET | ORAL | Status: DC | PRN

## 2012-08-02 MED ORDER — ONDANSETRON HCL 4 MG/2ML IJ SOLN
4.0000 mg | Freq: Once | INTRAMUSCULAR | Status: AC
  Administered 2012-08-02: 4 mg via INTRAVENOUS
  Filled 2012-08-02: qty 2

## 2012-08-02 NOTE — ED Notes (Signed)
Family at bedside. 

## 2012-08-02 NOTE — ED Notes (Signed)
Vital signs stable. 
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
## 2012-08-02 NOTE — ED Provider Notes (Signed)
Consult by Dr. Luiz Blare reviewed. Recommendations was for a repeat hemoglobin, discharged home if stable. Patient has not had any symptoms consistent with volume loss overnight. Vital signs are stable. Repeat hemoglobin essentially unchanged from midnight last night. Patient to be discharged home, followup with orthopedics as already discussed.  Gilda Crease, MD 08/02/12 [phone]
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
## 2012-08-02 NOTE — ED Notes (Addendum)
Patient resting.

## 2012-08-02 NOTE — ED Notes (Signed)
EDP is in with pt letting know the updates.

## 2013-10-01 ENCOUNTER — Encounter (INDEPENDENT_AMBULATORY_CARE_PROVIDER_SITE_OTHER): Payer: Self-pay | Admitting: *Deleted

## 2013-10-07 ENCOUNTER — Other Ambulatory Visit (INDEPENDENT_AMBULATORY_CARE_PROVIDER_SITE_OTHER): Payer: Self-pay | Admitting: *Deleted

## 2013-10-07 ENCOUNTER — Encounter (INDEPENDENT_AMBULATORY_CARE_PROVIDER_SITE_OTHER): Payer: Self-pay | Admitting: *Deleted

## 2013-10-07 DIAGNOSIS — Z1211 Encounter for screening for malignant neoplasm of colon: Secondary | ICD-10-CM

## 2013-11-22 ENCOUNTER — Telehealth (INDEPENDENT_AMBULATORY_CARE_PROVIDER_SITE_OTHER): Payer: Self-pay | Admitting: *Deleted

## 2013-11-22 DIAGNOSIS — Z1211 Encounter for screening for malignant neoplasm of colon: Secondary | ICD-10-CM

## 2013-11-22 NOTE — Telephone Encounter (Signed)
Patient needs movi prep 

## 2013-11-24 MED ORDER — PEG-KCL-NACL-NASULF-NA ASC-C 100 G PO SOLR: 1.0000 | Freq: Once | ORAL | Status: DC

## 2013-12-16 ENCOUNTER — Telehealth (INDEPENDENT_AMBULATORY_CARE_PROVIDER_SITE_OTHER): Payer: Self-pay | Admitting: *Deleted

## 2013-12-16 NOTE — Telephone Encounter (Signed)
  Procedure: tcs  Reason/Indication:  screening  Has patient had this procedure before?  no  If so, when, by whom and where?    Is there a family history of colon cancer?  no  Who?  What age when diagnosed?    Is patient diabetic?   no      Does patient have prosthetic heart valve?  no  Do you have a pacemaker?  no  Has patient ever had endocarditis? no  Has patient had joint replacement within last 12 months?  no  Does patient tend to be constipated or take laxatives? occassionally  Is patient on Coumadin, Plavix and/or Aspirin? yes  Medications: asa 81 mg daily, metoprolol 10 mg 1/2 tab daily, crestor 10 mg 1/2 tab daily, fish oil daily   Allergies: nkda  Medication Adjustment: asa 2 days  Procedure date & time: 01/12/14 at 1030

## 2013-12-17 NOTE — Telephone Encounter (Signed)
agree

## 2013-12-28 ENCOUNTER — Encounter (HOSPITAL_COMMUNITY): Payer: Self-pay | Admitting: Pharmacy Technician

## 2014-01-11 ENCOUNTER — Other Ambulatory Visit (INDEPENDENT_AMBULATORY_CARE_PROVIDER_SITE_OTHER): Payer: Self-pay | Admitting: *Deleted

## 2014-01-11 DIAGNOSIS — Z1211 Encounter for screening for malignant neoplasm of colon: Secondary | ICD-10-CM

## 2014-01-12 ENCOUNTER — Ambulatory Visit (HOSPITAL_COMMUNITY)
Admission: RE | Admit: 2014-01-12 | Discharge: 2014-01-12 | Disposition: A | Discharge status: Home / Self Care | Payer: Medicare Other | Source: Ambulatory Visit | Attending: Internal Medicine | Admitting: Internal Medicine

## 2014-01-12 ENCOUNTER — Encounter (HOSPITAL_COMMUNITY)
Admission: RE | Disposition: A | Discharge status: Home / Self Care | Payer: Self-pay | Source: Ambulatory Visit | Attending: Internal Medicine

## 2014-01-12 ENCOUNTER — Encounter (HOSPITAL_COMMUNITY): Payer: Self-pay | Admitting: *Deleted

## 2014-01-12 DIAGNOSIS — K644 Residual hemorrhoidal skin tags: Secondary | ICD-10-CM | POA: Insufficient documentation

## 2014-01-12 DIAGNOSIS — E119 Type 2 diabetes mellitus without complications: Secondary | ICD-10-CM | POA: Diagnosis not present

## 2014-01-12 DIAGNOSIS — Z1211 Encounter for screening for malignant neoplasm of colon: Secondary | ICD-10-CM | POA: Insufficient documentation

## 2014-01-12 DIAGNOSIS — E785 Hyperlipidemia, unspecified: Secondary | ICD-10-CM | POA: Insufficient documentation

## 2014-01-12 DIAGNOSIS — I10 Essential (primary) hypertension: Secondary | ICD-10-CM | POA: Diagnosis not present

## 2014-01-12 DIAGNOSIS — Z79899 Other long term (current) drug therapy: Secondary | ICD-10-CM | POA: Diagnosis not present

## 2014-01-12 DIAGNOSIS — K6389 Other specified diseases of intestine: Secondary | ICD-10-CM

## 2014-01-12 DIAGNOSIS — Z7982 Long term (current) use of aspirin: Secondary | ICD-10-CM | POA: Diagnosis not present

## 2014-01-12 DIAGNOSIS — F17200 Nicotine dependence, unspecified, uncomplicated: Secondary | ICD-10-CM | POA: Insufficient documentation

## 2014-01-12 HISTORY — PX: COLONOSCOPY: SHX5424

## 2014-01-12 SURGERY — COLONOSCOPY
Anesthesia: Moderate Sedation

## 2014-01-12 MED ORDER — SODIUM CHLORIDE 0.9 % IV SOLN
INTRAVENOUS | Status: DC
  Administered 2014-01-12: 10:00:00 via INTRAVENOUS

## 2014-01-12 MED ORDER — MIDAZOLAM HCL 5 MG/5ML IJ SOLN
INTRAMUSCULAR | Status: AC
  Filled 2014-01-12: qty 10

## 2014-01-12 MED ORDER — STERILE WATER FOR IRRIGATION IR SOLN
Status: DC | PRN
  Administered 2014-01-12: 10:00:00

## 2014-01-12 MED ORDER — MIDAZOLAM HCL 5 MG/5ML IJ SOLN
INTRAMUSCULAR | Status: DC | PRN
  Administered 2014-01-12: 2 mg via INTRAVENOUS
  Administered 2014-01-12: 1 mg via INTRAVENOUS
  Administered 2014-01-12: 2 mg via INTRAVENOUS

## 2014-01-12 MED ORDER — MEPERIDINE HCL 50 MG/ML IJ SOLN
INTRAMUSCULAR | Status: AC
  Filled 2014-01-12: qty 1

## 2014-01-12 MED ORDER — MEPERIDINE HCL 50 MG/ML IJ SOLN
INTRAMUSCULAR | Status: DC | PRN
  Administered 2014-01-12 (×2): 25 mg via INTRAVENOUS

## 2014-01-12 NOTE — Op Note (Signed)
COLONOSCOPY PROCEDURE REPORT  PATIENT:  [name]  MR#:  [id number] Birthdate:  [birth year], 67 y.o., male Endoscopist:  Dr. Rogene Houston, MD Referred By:  Dr. Delphina Cahill, MD  Procedure Date: 01/12/2014  Procedure:   Colonoscopy  Indications:  Patient is 35 old Caucasian male who is undergoing average risk screening colonoscopy. This is patient's first exam.  Informed Consent:  The procedure and risks were reviewed with the patient and informed consent was obtained.  Medications:  Demerol 50 mg IV Versed 5 mg IV  Description of procedure:  After a digital rectal exam was performed, that colonoscope was advanced from the anus through the rectum and colon to the area of the cecum, ileocecal valve and appendiceal orifice. The cecum was deeply intubated. These structures were well-seen and photographed for the record. From the level of the cecum and ileocecal valve, the scope was slowly and cautiously withdrawn. The mucosal surfaces were carefully surveyed utilizing scope tip to flexion to facilitate fold flattening as needed. The scope was pulled down into the rectum where a thorough exam including retroflexion was performed.  Findings:   Prep excellent. Normal mucosa of cecum, ascending colon, hepatic flexure, transverse colon, splenic flexure, descending and sigmoid colon. Normal rectal mucosa. Number is below the dentate line along with two anal papillae.   Therapeutic/Diagnostic Maneuvers Performed:   None  Complications:  None  Cecal Withdrawal Time:  7 minutes  Impression:  External hemorrhoids and two anal papillae otherwise normal colonoscopy.  Recommendations:  Standard instructions given. Next screening exam in 10 years.  Sheretha Shadd U  01/12/2014 10:48 AM  CC: Dr. Delphina Cahill, MD & Dr. Rayne Du ref. provider found

## 2014-01-12 NOTE — Discharge Instructions (Signed)
Resume usual medications and diet. °No driving for 24 hours. °Next screening exam in 10 years. ° ° °Colonoscopy, Care After °Refer to this sheet in the next few weeks. These instructions provide you with information on caring for yourself after your procedure. Your health care provider may also give you more specific instructions. Your treatment has been planned according to current medical practices, but problems sometimes occur. Call your health care provider if you have any problems or questions after your procedure. °WHAT TO EXPECT AFTER THE PROCEDURE  °After your procedure, it is typical to have the following: °· A small amount of blood in your stool. °· Moderate amounts of gas and mild abdominal cramping or bloating. °HOME CARE INSTRUCTIONS °· Do not drive, operate machinery, or sign important documents for 24 hours. °· You may shower and resume your regular physical activities, but move at a slower pace for the first 24 hours. °· Take frequent rest periods for the first 24 hours. °· Walk around or put a warm pack on your abdomen to help reduce abdominal cramping and bloating. °· Drink enough fluids to keep your urine clear or pale yellow. °· You may resume your normal diet as instructed by your health care provider. Avoid heavy or fried foods that are hard to digest. °· Avoid drinking alcohol for 24 hours or as instructed by your health care provider. °· Only take over-the-counter or prescription medicines as directed by your health care provider. °· If a tissue sample (biopsy) was taken during your procedure: °¨ Do not take aspirin or blood thinners for 7 days, or as instructed by your health care provider. °¨ Do not drink alcohol for 7 days, or as instructed by your health care provider. °¨ Eat soft foods for the first 24 hours. °SEEK MEDICAL CARE IF: °You have persistent spotting of blood in your stool 2-3 days after the procedure. °SEEK IMMEDIATE MEDICAL CARE IF: °· You have more than a small spotting of  blood in your stool. °· You pass large blood clots in your stool. °· Your abdomen is swollen (distended). °· You have nausea or vomiting. °· You have a fever. °· You have increasing abdominal pain that is not relieved with medicine. °Document Released: 11/21/2003 Document Revised: 01/27/2013 Document Reviewed: 12/14/2012 °Exit[employer]® Patient Information ©2015 [employer]. This information is not intended to replace advice given to you by your health care provider. Make sure you discuss any questions you have with your health care provider. ° °

## 2014-01-12 NOTE — H&P (Signed)
[name] is an 68 y.o. male.   Chief Complaint: Patient is here for colonoscopy. HPI: This 68 year old Caucasian it was for screening colonoscopy. The patient was examined. He denies abdominal pain change in his bowel habits rectal bleeding. Family history is negative for CRC.  Past Medical History  Diagnosis Date  . Diabetes mellitus without complication   . Hypertension   . MI (myocardial infarction)   . Hyperlipidemia     Past Surgical History  Procedure Laterality Date  . Right middle finger      Family History  Problem Relation Age of Onset  . Alzheimer's disease Mother   . Heart attack Father    Social History:  reports that he has been smoking.  He does not have any smokeless tobacco history on file. He reports that he drinks about .6 ounces of alcohol per week. He reports that he does not use illicit drugs.  Allergies: No Known Allergies  Medications Prior to Admission  Medication Sig Dispense Refill  . aspirin EC 81 MG tablet Take 81 mg by mouth daily.      . metoprolol tartrate (LOPRESSOR) 25 MG tablet Take 12.5 mg by mouth 2 (two) times daily.       . Omega-3 Fatty Acids (FISH OIL) 500 MG CAPS Take 500 mg by mouth daily.      . peg 3350 powder (MOVIPREP) 100 G SOLR Take 1 kit (200 g total) by mouth once.  1 kit  0  . pravastatin (PRAVACHOL) 40 MG tablet Take 40 mg by mouth daily.        No results found for this or any previous visit (from the past 48 hour(s)). No results found.  ROS  Blood pressure 145/86, pulse 82, temperature 98 F (36.7 C), temperature source Oral, resp. rate 16, SpO2 99.00%. Physical Exam  Constitutional: He appears well-developed and well-nourished.  HENT:  Mouth/Throat: Oropharynx is clear and moist.  Eyes: Conjunctivae are normal. No scleral icterus.  Neck: No thyromegaly present.  Cardiovascular: Normal rate, regular rhythm and normal heart sounds.   No murmur heard. Respiratory: Effort normal and breath sounds normal.  GI:  Soft. He exhibits no distension and no mass. There is no tenderness.  Musculoskeletal: He exhibits no edema.  Lymphadenopathy:    He has no cervical adenopathy.  Neurological: He is alert.  Skin: Skin is warm and dry.     Assessment/Plan Average risk screening colonoscopy.  Stuart Guillen U 01/12/2014, 10:20 AM

## 2014-01-14 ENCOUNTER — Encounter (HOSPITAL_COMMUNITY): Payer: Self-pay | Admitting: Internal Medicine

## 2015-05-22 ENCOUNTER — Telehealth: Payer: Self-pay | Admitting: Cardiovascular Disease

## 2015-05-22 NOTE — Telephone Encounter (Signed)
Per pt call.  Pt stated he spoke to Dr Angelena Form and to make a re establish appt.   Pt would like a call back with anything please.

## 2015-05-22 NOTE — Telephone Encounter (Signed)
Left message to call back  

## 2015-05-24 NOTE — Telephone Encounter (Signed)
Spoke with [name] and made appt for him to see Dr. Angelena Form on 06/12/15 at 8:15.

## 2015-06-12 ENCOUNTER — Ambulatory Visit: Payer: Self-pay | Admitting: Cardiovascular Disease

## 2015-07-24 ENCOUNTER — Ambulatory Visit: Payer: PPO | Admitting: Interventional Cardiology

## 2015-08-07 ENCOUNTER — Ambulatory Visit: Payer: Self-pay | Admitting: Cardiovascular Disease

## 2015-09-06 DIAGNOSIS — E782 Mixed hyperlipidemia: Secondary | ICD-10-CM | POA: Diagnosis not present

## 2015-09-06 DIAGNOSIS — R7301 Impaired fasting glucose: Secondary | ICD-10-CM | POA: Diagnosis not present

## 2015-09-08 DIAGNOSIS — I10 Essential (primary) hypertension: Secondary | ICD-10-CM | POA: Diagnosis not present

## 2015-09-08 DIAGNOSIS — R7301 Impaired fasting glucose: Secondary | ICD-10-CM | POA: Diagnosis not present

## 2015-09-08 DIAGNOSIS — E782 Mixed hyperlipidemia: Secondary | ICD-10-CM | POA: Diagnosis not present

## 2015-09-08 DIAGNOSIS — I2510 Atherosclerotic heart disease of native coronary artery without angina pectoris: Secondary | ICD-10-CM | POA: Diagnosis not present

## 2015-09-11 DIAGNOSIS — T1502XA Foreign body in cornea, left eye, initial encounter: Secondary | ICD-10-CM | POA: Diagnosis not present

## 2015-11-15 DIAGNOSIS — X32XXXD Exposure to sunlight, subsequent encounter: Secondary | ICD-10-CM | POA: Diagnosis not present

## 2015-11-15 DIAGNOSIS — D225 Melanocytic nevi of trunk: Secondary | ICD-10-CM | POA: Diagnosis not present

## 2015-11-15 DIAGNOSIS — L570 Actinic keratosis: Secondary | ICD-10-CM | POA: Diagnosis not present

## 2015-11-15 DIAGNOSIS — Z1283 Encounter for screening for malignant neoplasm of skin: Secondary | ICD-10-CM | POA: Diagnosis not present

## 2016-02-05 DIAGNOSIS — M9902 Segmental and somatic dysfunction of thoracic region: Secondary | ICD-10-CM | POA: Diagnosis not present

## 2016-02-05 DIAGNOSIS — M9903 Segmental and somatic dysfunction of lumbar region: Secondary | ICD-10-CM | POA: Diagnosis not present

## 2016-02-05 DIAGNOSIS — M545 Low back pain: Secondary | ICD-10-CM | POA: Diagnosis not present

## 2016-02-05 DIAGNOSIS — M9905 Segmental and somatic dysfunction of pelvic region: Secondary | ICD-10-CM | POA: Diagnosis not present

## 2016-02-07 DIAGNOSIS — M9902 Segmental and somatic dysfunction of thoracic region: Secondary | ICD-10-CM | POA: Diagnosis not present

## 2016-02-07 DIAGNOSIS — M9905 Segmental and somatic dysfunction of pelvic region: Secondary | ICD-10-CM | POA: Diagnosis not present

## 2016-02-07 DIAGNOSIS — M9903 Segmental and somatic dysfunction of lumbar region: Secondary | ICD-10-CM | POA: Diagnosis not present

## 2016-02-07 DIAGNOSIS — M545 Low back pain: Secondary | ICD-10-CM | POA: Diagnosis not present

## 2016-02-09 DIAGNOSIS — M9905 Segmental and somatic dysfunction of pelvic region: Secondary | ICD-10-CM | POA: Diagnosis not present

## 2016-02-09 DIAGNOSIS — M9902 Segmental and somatic dysfunction of thoracic region: Secondary | ICD-10-CM | POA: Diagnosis not present

## 2016-02-09 DIAGNOSIS — M545 Low back pain: Secondary | ICD-10-CM | POA: Diagnosis not present

## 2016-02-09 DIAGNOSIS — M9903 Segmental and somatic dysfunction of lumbar region: Secondary | ICD-10-CM | POA: Diagnosis not present

## 2016-02-13 DIAGNOSIS — R7301 Impaired fasting glucose: Secondary | ICD-10-CM | POA: Diagnosis not present

## 2016-02-13 DIAGNOSIS — I10 Essential (primary) hypertension: Secondary | ICD-10-CM | POA: Diagnosis not present

## 2016-04-10 DIAGNOSIS — Z961 Presence of intraocular lens: Secondary | ICD-10-CM | POA: Diagnosis not present

## 2016-04-10 DIAGNOSIS — H43811 Vitreous degeneration, right eye: Secondary | ICD-10-CM | POA: Diagnosis not present

## 2016-08-13 DIAGNOSIS — Z125 Encounter for screening for malignant neoplasm of prostate: Secondary | ICD-10-CM | POA: Diagnosis not present

## 2016-08-13 DIAGNOSIS — E782 Mixed hyperlipidemia: Secondary | ICD-10-CM | POA: Diagnosis not present

## 2016-08-13 DIAGNOSIS — I10 Essential (primary) hypertension: Secondary | ICD-10-CM | POA: Diagnosis not present

## 2016-08-15 DIAGNOSIS — Z0001 Encounter for general adult medical examination with abnormal findings: Secondary | ICD-10-CM | POA: Diagnosis not present

## 2016-08-15 DIAGNOSIS — I10 Essential (primary) hypertension: Secondary | ICD-10-CM | POA: Diagnosis not present

## 2016-08-15 DIAGNOSIS — M545 Low back pain: Secondary | ICD-10-CM | POA: Diagnosis not present

## 2016-08-15 DIAGNOSIS — I259 Chronic ischemic heart disease, unspecified: Secondary | ICD-10-CM | POA: Diagnosis not present

## 2016-08-15 DIAGNOSIS — Z6827 Body mass index (BMI) 27.0-27.9, adult: Secondary | ICD-10-CM | POA: Diagnosis not present

## 2016-08-15 DIAGNOSIS — E782 Mixed hyperlipidemia: Secondary | ICD-10-CM | POA: Diagnosis not present

## 2016-08-15 DIAGNOSIS — R7301 Impaired fasting glucose: Secondary | ICD-10-CM | POA: Diagnosis not present

## 2016-08-15 DIAGNOSIS — M25559 Pain in unspecified hip: Secondary | ICD-10-CM | POA: Diagnosis not present

## 2016-09-12 DIAGNOSIS — R42 Dizziness and giddiness: Secondary | ICD-10-CM | POA: Diagnosis not present

## 2016-09-12 DIAGNOSIS — I2510 Atherosclerotic heart disease of native coronary artery without angina pectoris: Secondary | ICD-10-CM | POA: Diagnosis not present

## 2016-09-12 DIAGNOSIS — I10 Essential (primary) hypertension: Secondary | ICD-10-CM | POA: Diagnosis not present

## 2016-10-10 DIAGNOSIS — H2511 Age-related nuclear cataract, right eye: Secondary | ICD-10-CM | POA: Diagnosis not present

## 2016-10-29 NOTE — Progress Notes (Signed)
Chief Complaint  Patient presents with  . New Evaluation    CAD   History of Present Illness: 71 yo male with history of CAD, DM, HLD, HTN and tobacco abuse who is here as a new patient to re-establish cardiac care. I had followed him in the past but he has not been seen in our office since December of 2011. His primary care doctor has tried to get him back to see [place] but he has cancelled appointments to re-establish care in February and April 2018. He had a NSTEMI in August of 2010 and was found to have a severe stenosis in the mid Circumflex artery treated with a large bare metal stent. He had moderate residual disease in the mid RCA at that time. LV function was normal in 2011.   He tells me today that he has had no exertional chest pain. No dyspnea. He has been very active. 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   Primary Care Physician: Celene Squibb, MD  Past Medical History:  Diagnosis Date  . CAD, NATIVE VESSEL 12/29/2008   Qualifier: Diagnosis of  By: Angelena Form, MD, Harrell Gave    . CHEST PAIN-UNSPECIFIED 12/24/2008   Qualifier: Diagnosis of  By: Ronne Binning    . Diabetes mellitus without complication ([place])   . DYSLIPIDEMIA 12/24/2008   Qualifier: Diagnosis of  By: Ronne Binning    . Hyperlipidemia   . Hypertension   . MI (myocardial infarction) ([place])   . TOBACCO USER 12/24/2008   Qualifier: Diagnosis of  By: Ronne Binning      Past Surgical History:  Procedure Laterality Date  . COLONOSCOPY N/A 01/12/2014   Procedure: COLONOSCOPY;  Surgeon: Rogene Houston, MD;  [facility];  Service: Endoscopy;  Laterality: N/A;  1030  . right middle finger      Current Outpatient Prescriptions  Medication Sig Dispense Refill  . aspirin EC 81 MG tablet Take 81 mg by mouth daily.    Marland Kitchen atorvastatin (LIPITOR) 10 MG tablet Take 10 mg by mouth daily.    . meloxicam (MOBIC) 15 MG tablet Take 15 mg by mouth daily as needed. As needed Arthritis    . metoprolol  tartrate (LOPRESSOR) 25 MG tablet Take 12.5 mg by mouth 2 (two) times daily.     . Omega-3 Fatty Acids (FISH OIL) 500 MG CAPS Take 500 mg by mouth daily.     No current facility-administered medications for this visit.     No Known Allergies  Social History   Social History  . Marital status: Divorced    Spouse name: N/A  . Number of children: N/A  . Years of education: N/A   Occupational History  . Not on file.   Social History Main Topics  . Smoking status: Current Every Day Smoker    Packs/day: 0.50    Years: 40.00  . Smokeless tobacco: Never Used  . Alcohol use 0.6 oz/week    1 Cans of beer per week  . Drug use: No  . Sexual activity: Not on file   Other Topics Concern  . Not on file   Social History Narrative  . No narrative on file    Family History  Problem Relation Age of Onset  . Alzheimer's disease Mother   . Heart attack Father     Review of Systems:  As stated in the HPI and otherwise negative.   BP 124/80   Pulse 81  Ht 5\' 9"  (1.753 m)   Wt 189 lb 12.8 oz (86.1 kg)   SpO2 98%   BMI 28.03 kg/m   Physical Examination: General: Well developed, well nourished, NAD  HEENT: OP clear, mucus membranes moist  SKIN: warm, dry. No rashes. Neuro: No focal deficits  Musculoskeletal: Muscle strength 5/5 all ext  Psychiatric: Mood and affect normal  Neck: No JVD, no carotid bruits, no thyromegaly, no lymphadenopathy.  Lungs:Clear bilaterally, no wheezes, rhonci, crackles Cardiovascular: Regular rate and rhythm. No murmurs, gallops or rubs. Abdomen:Soft. Bowel sounds present. Non-tender.  Extremities: No lower extremity edema. Pulses are 2 + in the bilateral DP/PT.  EKG:  EKG is ordered today. The ekg ordered today demonstrates NSR, rate 72 bpm. RBB. LPFB  Recent Labs: No results found for requested labs within last 8760 hours.   Lipid Panel    Component Value Date/Time   CHOL 106 05/17/2009 0122   TRIG 111 05/17/2009 0122   HDL 26 (L)  05/17/2009 0122   CHOLHDL 4.1 Ratio 05/17/2009 0122   VLDL 22 05/17/2009 0122   LDLCALC 58 05/17/2009 0122     Wt Readings from Last 3 Encounters:  10/30/16 189 lb 12.8 oz (86.1 kg)  08/01/12 185 lb (83.9 kg)  03/27/10 194 lb 8 oz (88.2 kg)     Other studies Reviewed: Additional studies/ records that were reviewed today include:  Review of the above records demonstrates:   Assessment and Plan:   1. CAD without angina: He is known to have CAD with prior stenting of the Circumflex artery in 2010 with moderate disease in the RCA at that time. He is having no chest pain suggestive of angina. Will continue ASA, beta blocker and statin. Will arrange exercise stress test.   2. HTN: BP is controlled. No changes. He did not tolerate  3. HLD: Continue statin. Lipids followed in primary care.   4. Tobacco abuse: Smoking cessation encouraged.   Current medicines are reviewed at length with the patient today.  The patient does not have concerns regarding medicines.  The following changes have been made:  no change  Labs/ tests ordered today include:   Orders Placed This Encounter  Procedures  . Exercise Tolerance Test  . EKG 12-Lead   Disposition:   FU with me in 12 months  Signed, Lauree Chandler, MD 10/30/2016 9:39 AM    [facility] [address], [place], [place]  [address] Phone: [phone]; Fax: [phone]

## 2016-10-30 ENCOUNTER — Encounter: Payer: Self-pay | Admitting: Cardiovascular Disease

## 2016-10-30 ENCOUNTER — Ambulatory Visit (INDEPENDENT_AMBULATORY_CARE_PROVIDER_SITE_OTHER): Payer: PPO | Admitting: Cardiovascular Disease

## 2016-10-30 ENCOUNTER — Encounter (INDEPENDENT_AMBULATORY_CARE_PROVIDER_SITE_OTHER): Payer: Self-pay

## 2016-10-30 VITALS — BP 124/80 | HR 81 | Ht 69.0 in | Wt 189.8 lb

## 2016-10-30 DIAGNOSIS — I2510 Atherosclerotic heart disease of native coronary artery without angina pectoris: Secondary | ICD-10-CM | POA: Diagnosis not present

## 2016-10-30 DIAGNOSIS — Z720 Tobacco use: Secondary | ICD-10-CM | POA: Diagnosis not present

## 2016-10-30 DIAGNOSIS — E7800 Pure hypercholesterolemia, unspecified: Secondary | ICD-10-CM

## 2016-10-30 DIAGNOSIS — I10 Essential (primary) hypertension: Secondary | ICD-10-CM

## 2016-10-30 NOTE — Patient Instructions (Signed)
Medication Instructions:  Your physician recommends that you continue on your current medications as directed. Please refer to the Current Medication list given to you today.   Labwork: none  Testing/Procedures: Your physician has requested that you have an exercise tolerance test. For further information please visit [email or web address]. Please also follow instruction sheet, as given.    Follow-Up: Your physician recommends that you schedule a follow-up appointment in: 12 months. Please call our office in about 9 months to schedule this appointment    Any Other Special Instructions Will Be Listed Below (If Applicable).     If you need a refill on your cardiac medications before your next appointment, please call your pharmacy.   

## 2016-11-11 DIAGNOSIS — I10 Essential (primary) hypertension: Secondary | ICD-10-CM | POA: Diagnosis not present

## 2016-11-11 DIAGNOSIS — Z6827 Body mass index (BMI) 27.0-27.9, adult: Secondary | ICD-10-CM | POA: Diagnosis not present

## 2016-11-11 DIAGNOSIS — R42 Dizziness and giddiness: Secondary | ICD-10-CM | POA: Diagnosis not present

## 2016-11-11 DIAGNOSIS — I2510 Atherosclerotic heart disease of native coronary artery without angina pectoris: Secondary | ICD-10-CM | POA: Diagnosis not present

## 2016-11-13 ENCOUNTER — Ambulatory Visit (INDEPENDENT_AMBULATORY_CARE_PROVIDER_SITE_OTHER): Payer: PPO

## 2016-11-13 DIAGNOSIS — I2510 Atherosclerotic heart disease of native coronary artery without angina pectoris: Secondary | ICD-10-CM

## 2016-11-14 LAB — EXERCISE TOLERANCE TEST
CHL CUP RESTING HR STRESS: 76 {beats}/min
CSEPPHR: 129 {beats}/min
Estimated workload: 6.3 METS
Exercise duration (min): 4 min
Exercise duration (sec): 27 s
MPHR: 152 {beats}/min
Percent HR: 85 %
RPE: 15

## 2016-11-15 ENCOUNTER — Other Ambulatory Visit: Payer: Self-pay | Admitting: *Deleted

## 2016-11-15 DIAGNOSIS — I2510 Atherosclerotic heart disease of native coronary artery without angina pectoris: Secondary | ICD-10-CM

## 2016-11-18 ENCOUNTER — Telehealth (HOSPITAL_COMMUNITY): Payer: Self-pay | Admitting: *Deleted

## 2016-11-18 NOTE — Telephone Encounter (Signed)
Patient given detailed instructions per Myocardial Perfusion Study Information Sheet for the test on 11/19/16. Patient notified to arrive 15 minutes early and that it is imperative to arrive on time for appointment to keep from having the test rescheduled.  If you need to cancel or reschedule your appointment, please call the office within 24 hours of your appointment. . Patient verbalized understanding. Kirstie Peri

## 2016-11-19 ENCOUNTER — Telehealth: Payer: Self-pay | Admitting: Cardiovascular Disease

## 2016-11-19 ENCOUNTER — Ambulatory Visit (HOSPITAL_COMMUNITY): Payer: PPO | Attending: Cardiology

## 2016-11-19 DIAGNOSIS — I4510 Unspecified right bundle-branch block: Secondary | ICD-10-CM | POA: Diagnosis not present

## 2016-11-19 DIAGNOSIS — R079 Chest pain, unspecified: Secondary | ICD-10-CM | POA: Insufficient documentation

## 2016-11-19 DIAGNOSIS — F17200 Nicotine dependence, unspecified, uncomplicated: Secondary | ICD-10-CM | POA: Diagnosis not present

## 2016-11-19 DIAGNOSIS — E785 Hyperlipidemia, unspecified: Secondary | ICD-10-CM | POA: Diagnosis not present

## 2016-11-19 DIAGNOSIS — I2510 Atherosclerotic heart disease of native coronary artery without angina pectoris: Secondary | ICD-10-CM | POA: Insufficient documentation

## 2016-11-19 LAB — MYOCARDIAL PERFUSION IMAGING
LV sys vol: 46 mL
LVDIAVOL: 104 mL (ref 62–150)
Peak HR: 93 {beats}/min
RATE: 0.27
Rest HR: 63 {beats}/min
SDS: 1
SRS: 0
SSS: 1
TID: 0.9

## 2016-11-19 MED ORDER — REGADENOSON 0.4 MG/5ML IV SOLN
0.4000 mg | Freq: Once | INTRAVENOUS | Status: AC
  Administered 2016-11-19: 0.4 mg via INTRAVENOUS

## 2016-11-19 MED ORDER — TECHNETIUM TC 99M TETROFOSMIN IV KIT
32.5000 | PACK | Freq: Once | INTRAVENOUS | Status: AC | PRN
  Administered 2016-11-19: 32.5 via INTRAVENOUS
  Filled 2016-11-19: qty 33

## 2016-11-19 MED ORDER — TECHNETIUM TC 99M TETROFOSMIN IV KIT
10.1000 | PACK | Freq: Once | INTRAVENOUS | Status: AC | PRN
  Administered 2016-11-19: 10.1 via INTRAVENOUS
  Filled 2016-11-19: qty 11

## 2016-11-19 NOTE — Telephone Encounter (Signed)
We can try Chantix if he would like. chris

## 2016-11-19 NOTE — Telephone Encounter (Signed)
I spoke with pt and told him about smoking cessation classes available through [facility].  He is interested in attending the class at [facility].  He cannot write phone number down at this time so he will call back to get phone number.  (information phone number is [phone]) He is also willing to try medication if Dr. Angelena Form feels it would be OK for him to take something.

## 2016-11-19 NOTE — Telephone Encounter (Signed)
Patient would like some information about quitting smoking.  Please call patient back regarding his options.

## 2016-11-20 MED ORDER — VARENICLINE TARTRATE 1 MG PO TABS: 1.0000 mg | ORAL_TABLET | Freq: Two times a day (BID) | ORAL | 0 refills | Status: DC

## 2016-11-20 MED ORDER — VARENICLINE TARTRATE 0.5 MG X 11 & 1 MG X 42 PO MISC: ORAL | 0 refills | Status: DC

## 2016-11-20 NOTE — Telephone Encounter (Signed)
Changes in dreams is a common side effect with Chantix. Generally recommend taking second dose in early afternoon to decrease bad dreams. If he experiences this can back down on dose or consider Zyban as this has been less associated with dream changes (though can still cause them just not as common).

## 2016-11-20 NOTE — Telephone Encounter (Signed)
I spoke with pt and gave him phone number for class information.  I will also mail him  "Steps to Quit Smoking" information.   He is interested in trying Chantix. Possible side effects discussed with pt.  He reports he does have bad dreams and insomnia at times. Will review with pharmacist regarding medication options.

## 2016-11-20 NOTE — Telephone Encounter (Signed)
I spoke with pt and gave him information from pharmacist. He would like to try Chantix.  Will send prescription to [facility].  He will let us know if he has problems taking this or it is too expensive

## 2017-03-10 DIAGNOSIS — R7301 Impaired fasting glucose: Secondary | ICD-10-CM | POA: Diagnosis not present

## 2017-03-10 DIAGNOSIS — E782 Mixed hyperlipidemia: Secondary | ICD-10-CM | POA: Diagnosis not present

## 2017-03-10 DIAGNOSIS — I10 Essential (primary) hypertension: Secondary | ICD-10-CM | POA: Diagnosis not present

## 2017-03-12 DIAGNOSIS — R7301 Impaired fasting glucose: Secondary | ICD-10-CM | POA: Diagnosis not present

## 2017-03-12 DIAGNOSIS — M545 Low back pain: Secondary | ICD-10-CM | POA: Diagnosis not present

## 2017-03-12 DIAGNOSIS — I10 Essential (primary) hypertension: Secondary | ICD-10-CM | POA: Diagnosis not present

## 2017-03-12 DIAGNOSIS — M25559 Pain in unspecified hip: Secondary | ICD-10-CM | POA: Diagnosis not present

## 2017-03-12 DIAGNOSIS — M06059 Rheumatoid arthritis without rheumatoid factor, unspecified hip: Secondary | ICD-10-CM | POA: Diagnosis not present

## 2017-03-12 DIAGNOSIS — Z6827 Body mass index (BMI) 27.0-27.9, adult: Secondary | ICD-10-CM | POA: Diagnosis not present

## 2017-03-12 DIAGNOSIS — I259 Chronic ischemic heart disease, unspecified: Secondary | ICD-10-CM | POA: Diagnosis not present

## 2017-03-12 DIAGNOSIS — E782 Mixed hyperlipidemia: Secondary | ICD-10-CM | POA: Diagnosis not present

## 2017-03-12 DIAGNOSIS — F5101 Primary insomnia: Secondary | ICD-10-CM | POA: Diagnosis not present

## 2017-03-12 DIAGNOSIS — M25562 Pain in left knee: Secondary | ICD-10-CM | POA: Diagnosis not present

## 2017-04-11 DIAGNOSIS — I10 Essential (primary) hypertension: Secondary | ICD-10-CM | POA: Diagnosis not present

## 2017-04-11 DIAGNOSIS — M150 Primary generalized (osteo)arthritis: Secondary | ICD-10-CM | POA: Diagnosis not present

## 2017-04-11 DIAGNOSIS — Z5181 Encounter for therapeutic drug level monitoring: Secondary | ICD-10-CM | POA: Diagnosis not present

## 2017-05-13 DIAGNOSIS — I10 Essential (primary) hypertension: Secondary | ICD-10-CM | POA: Diagnosis not present

## 2017-05-13 DIAGNOSIS — Z6827 Body mass index (BMI) 27.0-27.9, adult: Secondary | ICD-10-CM | POA: Diagnosis not present

## 2017-05-13 DIAGNOSIS — Z720 Tobacco use: Secondary | ICD-10-CM | POA: Diagnosis not present

## 2017-05-13 DIAGNOSIS — M150 Primary generalized (osteo)arthritis: Secondary | ICD-10-CM | POA: Diagnosis not present

## 2017-08-29 DIAGNOSIS — E782 Mixed hyperlipidemia: Secondary | ICD-10-CM | POA: Diagnosis not present

## 2017-08-29 DIAGNOSIS — R7301 Impaired fasting glucose: Secondary | ICD-10-CM | POA: Diagnosis not present

## 2017-09-03 DIAGNOSIS — I10 Essential (primary) hypertension: Secondary | ICD-10-CM | POA: Diagnosis not present

## 2017-09-03 DIAGNOSIS — M25559 Pain in unspecified hip: Secondary | ICD-10-CM | POA: Diagnosis not present

## 2017-09-03 DIAGNOSIS — Z6827 Body mass index (BMI) 27.0-27.9, adult: Secondary | ICD-10-CM | POA: Diagnosis not present

## 2017-09-03 DIAGNOSIS — R7301 Impaired fasting glucose: Secondary | ICD-10-CM | POA: Diagnosis not present

## 2017-09-03 DIAGNOSIS — M545 Low back pain: Secondary | ICD-10-CM | POA: Diagnosis not present

## 2017-09-03 DIAGNOSIS — I2510 Atherosclerotic heart disease of native coronary artery without angina pectoris: Secondary | ICD-10-CM | POA: Diagnosis not present

## 2017-09-03 DIAGNOSIS — M25562 Pain in left knee: Secondary | ICD-10-CM | POA: Diagnosis not present

## 2017-09-03 DIAGNOSIS — E782 Mixed hyperlipidemia: Secondary | ICD-10-CM | POA: Diagnosis not present

## 2017-11-11 DIAGNOSIS — M9904 Segmental and somatic dysfunction of sacral region: Secondary | ICD-10-CM | POA: Insufficient documentation

## 2017-11-11 DIAGNOSIS — M4180 Other forms of scoliosis, site unspecified: Secondary | ICD-10-CM | POA: Diagnosis not present

## 2017-11-11 DIAGNOSIS — M545 Low back pain: Secondary | ICD-10-CM | POA: Diagnosis not present

## 2017-11-11 DIAGNOSIS — M48061 Spinal stenosis, lumbar region without neurogenic claudication: Secondary | ICD-10-CM | POA: Diagnosis not present

## 2017-11-11 DIAGNOSIS — M4150 Other secondary scoliosis, site unspecified: Secondary | ICD-10-CM | POA: Insufficient documentation

## 2017-11-19 DIAGNOSIS — M48061 Spinal stenosis, lumbar region without neurogenic claudication: Secondary | ICD-10-CM | POA: Diagnosis not present

## 2017-11-25 DIAGNOSIS — I10 Essential (primary) hypertension: Secondary | ICD-10-CM | POA: Insufficient documentation

## 2017-11-25 DIAGNOSIS — M4180 Other forms of scoliosis, site unspecified: Secondary | ICD-10-CM | POA: Diagnosis not present

## 2017-11-25 DIAGNOSIS — M9904 Segmental and somatic dysfunction of sacral region: Secondary | ICD-10-CM | POA: Diagnosis not present

## 2017-11-25 DIAGNOSIS — M48061 Spinal stenosis, lumbar region without neurogenic claudication: Secondary | ICD-10-CM | POA: Diagnosis not present

## 2017-12-29 DIAGNOSIS — M545 Low back pain: Secondary | ICD-10-CM | POA: Diagnosis not present

## 2017-12-29 DIAGNOSIS — R5383 Other fatigue: Secondary | ICD-10-CM | POA: Diagnosis not present

## 2017-12-29 DIAGNOSIS — K4090 Unilateral inguinal hernia, without obstruction or gangrene, not specified as recurrent: Secondary | ICD-10-CM | POA: Diagnosis not present

## 2017-12-29 DIAGNOSIS — Z6827 Body mass index (BMI) 27.0-27.9, adult: Secondary | ICD-10-CM | POA: Diagnosis not present

## 2018-02-18 DIAGNOSIS — M150 Primary generalized (osteo)arthritis: Secondary | ICD-10-CM | POA: Diagnosis not present

## 2018-02-18 DIAGNOSIS — I10 Essential (primary) hypertension: Secondary | ICD-10-CM | POA: Diagnosis not present

## 2018-02-18 DIAGNOSIS — E782 Mixed hyperlipidemia: Secondary | ICD-10-CM | POA: Diagnosis not present

## 2018-03-09 DIAGNOSIS — E782 Mixed hyperlipidemia: Secondary | ICD-10-CM | POA: Diagnosis not present

## 2018-03-09 DIAGNOSIS — I10 Essential (primary) hypertension: Secondary | ICD-10-CM | POA: Diagnosis not present

## 2018-03-10 ENCOUNTER — Encounter: Payer: Self-pay | Admitting: General Surgery

## 2018-03-10 ENCOUNTER — Ambulatory Visit (INDEPENDENT_AMBULATORY_CARE_PROVIDER_SITE_OTHER): Payer: PPO | Admitting: General Surgery

## 2018-03-10 VITALS — BP 158/99 | HR 86 | Temp 98.9°F | Resp 18 | Wt 188.4 lb

## 2018-03-10 DIAGNOSIS — K4090 Unilateral inguinal hernia, without obstruction or gangrene, not specified as recurrent: Secondary | ICD-10-CM | POA: Insufficient documentation

## 2018-03-10 NOTE — Progress Notes (Signed)
[name] History and Physical  Reason for Referral: Right inguinal hernia  Referring Physician:  Dr. Nevada Crane   Chief Complaint    Inguinal Hernia      [name] is a 72 y.o. male.  HPI: [name] is a 72 yo with a right inguinal hernia that he noticed September of this year. It has been causing him pain and discomfort with ambulation. He says that it has been soft and reducible, but he tries not to mess with it. He denies any obstructive symptoms like nausea/vomiting or changes in bowels.   He denies noticing anything on the left. He is active and hunts and notices that the hernia bothers him walking up hills or after being active. .    Past Medical History:  Diagnosis Date  . CAD, NATIVE VESSEL 12/29/2008   Qualifier: Diagnosis of  By: Angelena Form, MD, Harrell Gave    . CHEST PAIN-UNSPECIFIED 12/24/2008   Qualifier: Diagnosis of  By: Ronne Binning    . Diabetes mellitus without complication ([place])   . DYSLIPIDEMIA 12/24/2008   Qualifier: Diagnosis of  By: Ronne Binning    . Hyperlipidemia   . Hypertension   . MI (myocardial infarction) ([place])   . TOBACCO USER 12/24/2008   Qualifier: Diagnosis of  By: Ronne Binning      Past Surgical History:  Procedure Laterality Date  . COLONOSCOPY N/A 01/12/2014   Procedure: COLONOSCOPY;  Surgeon: Rogene Houston, MD;  [facility];  Service: Endoscopy;  Laterality: N/A;  1030  . right middle finger      Family History  Problem Relation Age of Onset  . Alzheimer's disease Mother   . Heart attack Father     Social History   Tobacco Use  . Smoking status: Current Every Day Smoker    Packs/day: 0.50    Years: 40.00    Pack years: 20.00  . Smokeless tobacco: Never Used  Substance Use Topics  . Alcohol use: Yes    Alcohol/week: 1.0 standard drinks    Types: 1 Cans of beer per week  . Drug use: No    Medications: I have reviewed the patient's current medications. Allergies as of 03/10/2018   No  Known Allergies     Medication List        Accurate as of 03/10/18 12:31 PM. Always use your most recent med list.          aspirin EC 81 MG tablet Take 81 mg by mouth daily.   atorvastatin 10 MG tablet Commonly known as:  LIPITOR Take 10 mg by mouth daily.   Fish Oil 500 MG Caps Take 500 mg by mouth daily.   meloxicam 15 MG tablet Commonly known as:  MOBIC Take 15 mg by mouth daily as needed. As needed Arthritis   metoprolol tartrate 25 MG tablet Commonly known as:  LOPRESSOR Take 12.5 mg by mouth 2 (two) times daily.   varenicline 0.5 MG X 11 & 1 MG X 42 tablet Commonly known as:  CHANTIX PAK Take as directed   varenicline 1 MG tablet Commonly known as:  CHANTIX Take 1 tablet (1 mg total) by mouth 2 (two) times daily.        ROS:  A comprehensive review of systems was negative except for: Respiratory: positive for cough Gastrointestinal: positive for reflux symptoms and right inguinal hernia Musculoskeletal: positive for back pain and stiff joints  Blood pressure (!) 158/99, pulse 86, temperature 98.9 F (  37.2 C), temperature source Temporal, resp. rate 18, weight 188 lb 6.4 oz (85.5 kg). Physical Exam  Constitutional: He is oriented to person, place, and time. He appears well-developed and well-nourished.  HENT:  Head: Normocephalic.  Eyes: Pupils are equal, round, and reactive to light.  Neck: Normal range of motion.  Cardiovascular: Normal rate and regular rhythm.  Pulmonary/Chest: Effort normal and breath sounds normal.  Abdominal: Soft. He exhibits no distension. There is no tenderness. A hernia is present. Hernia confirmed positive in the right inguinal area. Hernia confirmed negative in the left inguinal area.  Musculoskeletal: Normal range of motion. He exhibits no edema.  Neurological: He is alert and oriented to person, place, and time.  Skin: Skin is warm and dry.  Psychiatric: He has a normal mood and affect. His behavior is normal. Judgment  and thought content normal.  Vitals reviewed.   Results: None    Assessment & Plan:  [name] is a 72 y.o. male with a right inguinal hernia that is causing him discomfort. He would like to get this repaired. He has not seen his cardiologist in a while and reports no new symptoms. He says that he was planning on seeing him soon. He had a history of a MI and stent around 2010.  - Will get him an appt with Dr. Angelena Form to risk stratify given that he reports planning on seeing him. The last appt was 10/2016 and his follow up was going to be in 12 months.  - Patient wants to wait until the new year to have his surgery.   All questions were answered to the satisfaction of the patient and family.  Discussed the risk and benefits including, bleeding, infection, use of mesh, risk of recurrence, risk of nerve damage causing numbness or changes in sensation, risk of damage to the cord structures. The patient understands the risk and benefits of repair with mesh, and has decided to proceed.  We also discussed open versus laparoscopic surgery and the use of mesh. We discussed that I do open repairs with mesh, and that this is considered equivalent to laparoscopic surgery. We discussed reasons for opting for laparoscopic surgery including if a bilateral repair is needed or if a patient has a recurrence after an open repair.  Virl Cagey 03/10/2018, 12:31 PM

## 2018-03-10 NOTE — Patient Instructions (Signed)
Open Hernia Repair, Adult Open hernia repair is a surgical procedure to fix a hernia. A hernia occurs when an internal organ or tissue pushes out through a weak spot in the abdominal wall muscles. Hernias commonly occur in the groin and around the navel. Most hernias tend to get worse over time. Often, surgery is done to prevent the hernia from becoming bigger, uncomfortable, or an emergency. Emergency surgery may be needed if abdominal contents get stuck in the opening (incarcerated hernia) or the blood supply gets cut off (strangulated hernia). In an open repair, an incision is made in the abdomen to perform the surgery. Tell a health care provider about:  Any allergies you have.  All medicines you are taking, including vitamins, herbs, eye drops, creams, and over-the-counter medicines.  Any problems you or family members have had with anesthetic medicines.  Any blood or bone disorders you have.  Any surgeries you have had.  Any medical conditions you have, including any recent cold or flu symptoms.  Whether you are pregnant or may be pregnant. What are the risks? Generally, this is a safe procedure. However, problems may occur, including:  Long-lasting (chronic) pain.  Bleeding.  Infection.  Damage to the testicle. This can cause shrinking or swelling.  Damage to the bladder, blood vessels, intestine, or nerves near the hernia.  Trouble passing urine.  Allergic reactions to medicines.  Return of the hernia.  Medicines  Ask your health care provider about: ? Changing or stopping your regular medicines. This is especially important if you are taking diabetes medicines or blood thinners. ? Taking medicines such as aspirin and ibuprofen. These medicines can thin your blood. Do not take these medicines before your procedure if your health care provider instructs you not to.  You may be given antibiotic medicine to help prevent infection. General instructions  You may have  blood tests or imaging studies.  Ask your health care provider how your surgical site will be marked or identified.  If you smoke, do not smoke for at least 2 weeks before your procedure or for as long as told by your health care provider.  Let your health care provider know if you develop a cold or any infection before your surgery.  Plan to have someone take you home from the hospital or clinic.  If you will be going home right after the procedure, plan to have someone with you for 24 hours. What happens during the procedure?  To reduce your risk of infection: ? Your health care team will wash or sanitize their hands. ? Your skin will be washed with soap. ? Hair may be removed from the surgical area.  An IV tube will be inserted into one of your veins.  You will be given one or more of the following: ? A medicine to help you relax (sedative). ? A medicine to numb the area (local anesthetic). ? A medicine to make you fall asleep (general anesthetic).  Your surgeon will make an incision over the hernia.  The tissues of the hernia will be moved back into place.  The edges of the hernia may be stitched together.  The opening in the abdominal muscles will be closed with stitches (sutures). Or, your surgeon will place a mesh patch made of manmade (synthetic) material over the opening.  The incision will be closed.  A bandage (dressing) may be placed over the incision. The procedure may vary among health care providers and hospitals. What happens after the procedure?  Your blood pressure, heart rate, breathing rate, and blood oxygen level will be monitored until the medicines you were given have worn off.  You may be given medicine for pain.  Do not drive for 24 hours if you received a sedative. This information is not intended to replace advice given to you by your health care provider. Make sure you discuss any questions you have with your health care provider. Document  Released: 10/02/2000 Document Revised: 10/27/2015 Document Reviewed: 09/20/2015 Elsevier Interactive Patient Education  2018 [place]. Inguinal Hernia, Adult An inguinal hernia is when fat or the intestines push through the area where the leg meets the lower belly (groin) and make a rounded lump (bulge). This condition happens over time. There are three types of inguinal hernias. These types include:  Hernias that can be pushed back into the belly (are reducible).  Hernias that cannot be pushed back into the belly (are incarcerated).  Hernias that cannot be pushed back into the belly and lose their blood supply (get strangulated). This type needs emergency surgery.  Follow these instructions at home: Lifestyle  Drink enough fluid to keep your urine (pee) clear or pale yellow.  Eat plenty of fruits, vegetables, and whole grains. These have a lot of fiber. Talk with your doctor if you have questions.  Avoid lifting heavy objects.  Avoid standing for long periods of time.  Do not use tobacco products. These include cigarettes, chewing tobacco, or e-cigarettes. If you need help quitting, ask your doctor.  Try to stay at a healthy weight. General instructions  Do not try to force the hernia back in.  Watch your hernia for any changes in color or size. Let your doctor know if there are any changes.  Take over-the-counter and prescription medicines only as told by your doctor.  Keep all follow-up visits as told by your doctor. This is important. Contact a doctor if:  You have a fever.  You have new symptoms.  Your symptoms get worse. Get help right away if:  The area where the legs meets the lower belly has: ? Pain that gets worse suddenly. ? A bulge that gets bigger suddenly and does not go down. ? A bulge that turns red or purple. ? A bulge that is painful to the touch.  You are a man and your scrotum: ? Suddenly feels painful. ? Suddenly changes in size.  You  feel sick to your stomach (nauseous) and this feeling does not go away.  You throw up (vomit) and this keeps happening.  You feel your heart beating a lot more quickly than normal.  You cannot poop (have a bowel movement) or pass gas. This information is not intended to replace advice given to you by your health care provider. Make sure you discuss any questions you have with your health care provider. Document Released: 05/09/2006 Document Revised: 09/14/2015 Document Reviewed: 02/16/2014 Elsevier Interactive Patient Education  2018 [employer].

## 2018-03-10 NOTE — Progress Notes (Signed)
Can we get him in to see me or an office APP for pre-op visit? Thanks, chris

## 2018-03-11 DIAGNOSIS — E782 Mixed hyperlipidemia: Secondary | ICD-10-CM | POA: Diagnosis not present

## 2018-03-11 DIAGNOSIS — R7301 Impaired fasting glucose: Secondary | ICD-10-CM | POA: Diagnosis not present

## 2018-03-11 DIAGNOSIS — M06059 Rheumatoid arthritis without rheumatoid factor, unspecified hip: Secondary | ICD-10-CM | POA: Diagnosis not present

## 2018-03-11 DIAGNOSIS — F5101 Primary insomnia: Secondary | ICD-10-CM | POA: Diagnosis not present

## 2018-03-11 DIAGNOSIS — I10 Essential (primary) hypertension: Secondary | ICD-10-CM | POA: Diagnosis not present

## 2018-03-11 DIAGNOSIS — M25562 Pain in left knee: Secondary | ICD-10-CM | POA: Diagnosis not present

## 2018-03-11 DIAGNOSIS — R5383 Other fatigue: Secondary | ICD-10-CM | POA: Diagnosis not present

## 2018-03-11 DIAGNOSIS — I259 Chronic ischemic heart disease, unspecified: Secondary | ICD-10-CM | POA: Diagnosis not present

## 2018-03-11 DIAGNOSIS — M545 Low back pain: Secondary | ICD-10-CM | POA: Diagnosis not present

## 2018-03-11 DIAGNOSIS — H259 Unspecified age-related cataract: Secondary | ICD-10-CM | POA: Diagnosis not present

## 2018-03-11 DIAGNOSIS — K4090 Unilateral inguinal hernia, without obstruction or gangrene, not specified as recurrent: Secondary | ICD-10-CM | POA: Diagnosis not present

## 2018-03-11 DIAGNOSIS — I2510 Atherosclerotic heart disease of native coronary artery without angina pectoris: Secondary | ICD-10-CM | POA: Diagnosis not present

## 2018-03-18 ENCOUNTER — Ambulatory Visit (INDEPENDENT_AMBULATORY_CARE_PROVIDER_SITE_OTHER): Payer: PPO | Admitting: Cardiovascular Disease

## 2018-03-18 ENCOUNTER — Encounter: Payer: Self-pay | Admitting: Cardiovascular Disease

## 2018-03-18 VITALS — BP 126/80 | HR 80 | Ht 69.0 in | Wt 189.8 lb

## 2018-03-18 DIAGNOSIS — E7800 Pure hypercholesterolemia, unspecified: Secondary | ICD-10-CM | POA: Diagnosis not present

## 2018-03-18 DIAGNOSIS — I2510 Atherosclerotic heart disease of native coronary artery without angina pectoris: Secondary | ICD-10-CM

## 2018-03-18 DIAGNOSIS — M25569 Pain in unspecified knee: Secondary | ICD-10-CM | POA: Diagnosis not present

## 2018-03-18 DIAGNOSIS — Z01810 Encounter for preprocedural cardiovascular examination: Secondary | ICD-10-CM

## 2018-03-18 DIAGNOSIS — M25559 Pain in unspecified hip: Secondary | ICD-10-CM | POA: Diagnosis not present

## 2018-03-18 DIAGNOSIS — E782 Mixed hyperlipidemia: Secondary | ICD-10-CM | POA: Diagnosis not present

## 2018-03-18 DIAGNOSIS — K4090 Unilateral inguinal hernia, without obstruction or gangrene, not specified as recurrent: Secondary | ICD-10-CM | POA: Diagnosis not present

## 2018-03-18 DIAGNOSIS — Z0000 Encounter for general adult medical examination without abnormal findings: Secondary | ICD-10-CM | POA: Diagnosis not present

## 2018-03-18 DIAGNOSIS — I10 Essential (primary) hypertension: Secondary | ICD-10-CM

## 2018-03-18 DIAGNOSIS — M545 Low back pain: Secondary | ICD-10-CM | POA: Diagnosis not present

## 2018-03-18 DIAGNOSIS — R7301 Impaired fasting glucose: Secondary | ICD-10-CM | POA: Diagnosis not present

## 2018-03-18 DIAGNOSIS — Z720 Tobacco use: Secondary | ICD-10-CM | POA: Diagnosis not present

## 2018-03-18 NOTE — Progress Notes (Signed)
Chief Complaint  Patient presents with  . Follow-up    CAD   History of Present Illness: 72 yo male with history of CAD, DM, HLD, HTN and tobacco abuse who is here for follow up. He had a NSTEMI in August of 2010 and was found to have a severe stenosis in the mid Circumflex artery treated with a large bare metal stent. He had moderate residual disease in the mid RCA at that time. LV function was normal in 2011. Nuclear stress test in July 2018 with no ischemia.   He is here today for follow up. The patient denies any chest pain, dyspnea, palpitations, lower extremity edema, orthopnea, PND, dizziness, near syncope or syncope. He will need a hernia repair in January 2020. He has been feeling well.    Primary Care Physician: Celene Squibb, MD  Past Medical History:  Diagnosis Date  . CAD, NATIVE VESSEL 12/29/2008   Qualifier: Diagnosis of  By: Angelena Form, MD, Harrell Gave    . CHEST PAIN-UNSPECIFIED 12/24/2008   Qualifier: Diagnosis of  By: Ronne Binning    . Diabetes mellitus without complication ([place])   . DYSLIPIDEMIA 12/24/2008   Qualifier: Diagnosis of  By: Ronne Binning    . Hyperlipidemia   . Hypertension   . MI (myocardial infarction) ([place])   . TOBACCO USER 12/24/2008   Qualifier: Diagnosis of  By: Ronne Binning      Past Surgical History:  Procedure Laterality Date  . COLONOSCOPY N/A 01/12/2014   Procedure: COLONOSCOPY;  Surgeon: Rogene Houston, MD;  Location: [facility];  Service: Endoscopy;  Laterality: N/A;  1030  . right middle finger      Current Outpatient Medications  Medication Sig Dispense Refill  . aspirin EC 81 MG tablet Take 81 mg by mouth daily.    Marland Kitchen atorvastatin (LIPITOR) 20 MG tablet Take 20 mg by mouth daily.     Marland Kitchen lisinopril (PRINIVIL,ZESTRIL) 20 MG tablet Take 20 mg by mouth daily.    . meloxicam (MOBIC) 15 MG tablet Take 15 mg by mouth daily as needed. As needed Arthritis    . metoprolol tartrate (LOPRESSOR) 25 MG tablet Take 12.5 mg by mouth 2  (two) times daily.     . Omega-3 Fatty Acids (FISH OIL) 500 MG CAPS Take 500 mg by mouth daily.    . varenicline (CHANTIX CONTINUING MONTH PAK) 1 MG tablet Take 1 tablet (1 mg total) by mouth 2 (two) times daily. (Patient not taking: Reported on 03/18/2018) 60 tablet 0  . varenicline (CHANTIX STARTING MONTH PAK) 0.5 MG X 11 & 1 MG X 42 tablet Take as directed (Patient not taking: Reported on 03/18/2018) 53 tablet 0   No current facility-administered medications for this visit.     No Known Allergies  Social History   Socioeconomic History  . Marital status: Divorced    Spouse name: Not on file  . Number of children: Not on file  . Years of education: Not on file  . Highest education level: Not on file  Occupational History  . Not on file  Social Needs  . Financial resource strain: Not on file  . Food insecurity:    Worry: Not on file    Inability: Not on file  . Transportation needs:    Medical: Not on file    Non-medical: Not on file  Tobacco Use  . Smoking status: Current Every Day Smoker    Packs/day: 0.50    Years: 40.00  Pack years: 20.00  . Smokeless tobacco: Never Used  Substance and Sexual Activity  . Alcohol use: Yes    Alcohol/week: 1.0 standard drinks    Types: 1 Cans of beer per week  . Drug use: No  . Sexual activity: Not on file  Lifestyle  . Physical activity:    Days per week: Not on file    Minutes per session: Not on file  . Stress: Not on file  Relationships  . Social connections:    Talks on phone: Not on file    Gets together: Not on file    Attends religious service: Not on file    Active member of club or organization: Not on file    Attends meetings of clubs or organizations: Not on file    Relationship status: Not on file  . Intimate partner violence:    Fear of current or ex partner: Not on file    Emotionally abused: Not on file    Physically abused: Not on file    Forced sexual activity: Not on file  Other Topics Concern  . Not  on file  Social History Narrative  . Not on file    Family History  Problem Relation Age of Onset  . Alzheimer's disease Mother   . Heart attack Father     Review of Systems:  As stated in the HPI and otherwise negative.   BP 126/80   Pulse 80   Ht 5\' 9"  (1.753 m)   Wt 189 lb 12.8 oz (86.1 kg)   SpO2 98%   BMI 28.03 kg/m   Physical Examination: General: Well developed, well nourished, NAD  HEENT: OP clear, mucus membranes moist  SKIN: warm, dry. No rashes. Neuro: No focal deficits  Musculoskeletal: Muscle strength 5/5 all ext  Psychiatric: Mood and affect normal  Neck: No JVD, no carotid bruits, no thyromegaly, no lymphadenopathy.  Lungs:Clear bilaterally, no wheezes, rhonci, crackles Cardiovascular: Regular rate and rhythm. No murmurs, gallops or rubs. Abdomen:Soft. Bowel sounds present. Non-tender.  Extremities: No lower extremity edema. Pulses are 2 + in the bilateral DP/PT.  EKG:  EKG is ordered today. The ekg ordered today demonstrates NSR, rate 80 bpm. RBBB  Recent Labs: No results found for requested labs within last 8760 hours.   Lipid Panel    Component Value Date/Time   CHOL 106 05/17/2009 0122   TRIG 111 05/17/2009 0122   HDL 26 (L) 05/17/2009 0122   CHOLHDL 4.1 Ratio 05/17/2009 0122   VLDL 22 05/17/2009 0122   LDLCALC 58 05/17/2009 0122     Wt Readings from Last 3 Encounters:  03/18/18 189 lb 12.8 oz (86.1 kg)  03/10/18 188 lb 6.4 oz (85.5 kg)  10/30/16 189 lb 12.8 oz (86.1 kg)     Other studies Reviewed: Additional studies/ records that were reviewed today include:  Review of the above records demonstrates:   Assessment and Plan:   1. CAD without angina: He is known to have CAD with prior stenting of the Circumflex artery in 2010 with moderate disease in the RCA at that time. Nuclear stress test in July 2018 without ischemia. He is having no chest pain. Continue ASA, statin and beta blocker.   2. HTN: BP is controlled. No changes.   3.  HLD: Lipids followed in primary care. LDL 88. Goal of 70. Statin increased in primary care. Continue statin.   4. Tobacco abuse: I have asked him to stop smoking.   5. Pre-operative cardiovascular risk assessment:  He has no signs or symptoms of angina, CHF or arrhythmias. EKG is unchanged. OK to proceed with planned surgical procedure.    Current medicines are reviewed at length with the patient today.  The patient does not have concerns regarding medicines.  The following changes have been made:  no change  Labs/ tests ordered today include:   No orders of the defined types were placed in this encounter.  Disposition:   FU with me in 12 months  Signed, Lauree Chandler, MD 03/18/2018 4:21 PM    [facility] [place], [place], [place]  [address] Phone: [phone]; Fax: [phone]

## 2018-03-18 NOTE — Patient Instructions (Signed)

## 2018-03-23 NOTE — Addendum Note (Signed)
Addended by: Mendel Ryder on: 03/23/2018 01:52 PM   Modules accepted: Orders

## 2018-04-03 DIAGNOSIS — I10 Essential (primary) hypertension: Secondary | ICD-10-CM | POA: Diagnosis not present

## 2018-04-03 DIAGNOSIS — E782 Mixed hyperlipidemia: Secondary | ICD-10-CM | POA: Diagnosis not present

## 2018-04-07 DIAGNOSIS — X32XXXD Exposure to sunlight, subsequent encounter: Secondary | ICD-10-CM | POA: Diagnosis not present

## 2018-04-07 DIAGNOSIS — L570 Actinic keratosis: Secondary | ICD-10-CM | POA: Diagnosis not present

## 2018-04-07 DIAGNOSIS — D225 Melanocytic nevi of trunk: Secondary | ICD-10-CM | POA: Diagnosis not present

## 2018-04-07 DIAGNOSIS — C4441 Basal cell carcinoma of skin of scalp and neck: Secondary | ICD-10-CM | POA: Diagnosis not present

## 2018-04-20 NOTE — Patient Instructions (Signed)
[name]  04/20/2018     @PREFPERIOPPHARMACY @   Your procedure is scheduled on  05/01/2018  Report to [facility] at  615  A.M.  Call this number if you have problems the morning of surgery:  [phone]   Remember:  Do not eat or drink after midnight.                       Take these medicines the morning of surgery with A SIP OF WATER  Lisinopril, mobic, metoprolol.    Do not wear jewelry, make-up or nail polish.  Do not wear lotions, powders, or perfumes, or deodorant.  Do not shave 48 hours prior to surgery.  Men may shave face and neck.  Do not bring valuables to the hospital.  [facility] is not responsible for any belongings or valuables.  Contacts, dentures or bridgework may not be worn into surgery.  Leave your suitcase in the car.  After surgery it may be brought to your room.  For patients admitted to the hospital, discharge time will be determined by your treatment team.  Patients discharged the day of surgery will not be allowed to drive home.   Name and phone number of your driver:   family Special instructions:  None  Please read over the following fact sheets that you were given. Anesthesia Post-op Instructions and Care and Recovery After Surgery       Open Hernia Repair, Adult  Open hernia repair is a surgical procedure to fix a hernia. A hernia occurs when an internal organ or tissue pushes out through a weak spot in the abdominal wall muscles. Hernias commonly occur in the groin and around the navel. Most hernias tend to get worse over time. Often, surgery is done to prevent the hernia from becoming bigger, uncomfortable, or an emergency. Emergency surgery may be needed if abdominal contents get stuck in the opening (incarcerated hernia) or the blood supply gets cut off (strangulated hernia). In an open repair, an incision is made in the abdomen to perform the surgery. Tell a health care provider about:  Any allergies you  have.  All medicines you are taking, including vitamins, herbs, eye drops, creams, and over-the-counter medicines.  Any problems you or family members have had with anesthetic medicines.  Any blood or bone disorders you have.  Any surgeries you have had.  Any medical conditions you have, including any recent cold or flu symptoms.  Whether you are pregnant or may be pregnant. What are the risks? Generally, this is a safe procedure. However, problems may occur, including:  Long-lasting (chronic) pain.  Bleeding.  Infection.  Damage to the testicle. This can cause shrinking or swelling.  Damage to the bladder, blood vessels, intestine, or nerves near the hernia.  Trouble passing urine.  Allergic reactions to medicines.  Return of the hernia. What happens before the procedure? Staying hydrated Follow instructions from your health care provider about hydration, which may include:  Up to 2 hours before the procedure - you may continue to drink clear liquids, such as water, clear fruit juice, black coffee, and plain tea. Eating and drinking restrictions Follow instructions from your health care provider about eating and drinking, which may include:  8 hours before the procedure - stop eating heavy meals or foods such as meat, fried foods, or fatty foods.  6 hours before the procedure - stop eating light meals or  foods, such as toast or cereal.  6 hours before the procedure - stop drinking milk or drinks that contain milk.  2 hours before the procedure - stop drinking clear liquids. Medicines  Ask your health care provider about: ? Changing or stopping your regular medicines. This is especially important if you are taking diabetes medicines or blood thinners. ? Taking medicines such as aspirin and ibuprofen. These medicines can thin your blood. Do not take these medicines before your procedure if your health care provider instructs you not to.  You may be given antibiotic  medicine to help prevent infection. General instructions  You may have blood tests or imaging studies.  Ask your health care provider how your surgical site will be marked or identified.  If you smoke, do not smoke for at least 2 weeks before your procedure or for as long as told by your health care provider.  Let your health care provider know if you develop a cold or any infection before your surgery.  Plan to have someone take you home from the hospital or clinic.  If you will be going home right after the procedure, plan to have someone with you for 24 hours. What happens during the procedure?  To reduce your risk of infection: ? Your health care team will wash or sanitize their hands. ? Your skin will be washed with soap. ? Hair may be removed from the surgical area.  An IV tube will be inserted into one of your veins.  You will be given one or more of the following: ? A medicine to help you relax (sedative). ? A medicine to numb the area (local anesthetic). ? A medicine to make you fall asleep (general anesthetic).  Your surgeon will make an incision over the hernia.  The tissues of the hernia will be moved back into place.  The edges of the hernia may be stitched together.  The opening in the abdominal muscles will be closed with stitches (sutures). Or, your surgeon will place a mesh patch made of manmade (synthetic) material over the opening.  The incision will be closed.  A bandage (dressing) may be placed over the incision. The procedure may vary among health care providers and hospitals. What happens after the procedure?  Your blood pressure, heart rate, breathing rate, and blood oxygen level will be monitored until the medicines you were given have worn off.  You may be given medicine for pain.  Do not drive for 24 hours if you received a sedative. This information is not intended to replace advice given to you by your health care provider. Make sure you  discuss any questions you have with your health care provider. Document Released: 10/02/2000 Document Revised: 10/27/2015 Document Reviewed: 09/20/2015 Elsevier Interactive Patient Education  2019 [place] Repair, Adult, Care After These instructions give you information about caring for yourself after your procedure. Your doctor may also give you more specific instructions. If you have problems or questions, contact your doctor. Follow these instructions at home: Surgical cut (incision) care   Follow instructions from your doctor about how to take care of your surgical cut area. Make sure you: ? Wash your hands with soap and water before you change your bandage (dressing). If you cannot use soap and water, use hand sanitizer. ? Change your bandage as told by your doctor. ? Leave stitches (sutures), skin glue, or skin tape (adhesive) strips in place. They may need to stay in place for 2  weeks or longer. If tape strips get loose and curl up, you may trim the loose edges. Do not remove tape strips completely unless your doctor says it is okay.  Check your surgical cut every day for signs of infection. Check for: ? More redness, swelling, or pain. ? More fluid or blood. ? Warmth. ? Pus or a bad smell. Activity  Do not drive or use heavy machinery while taking prescription pain medicine. Do not drive until your doctor says it is okay.  Until your doctor says it is okay: ? Do not lift anything that is heavier than 10 lb (4.5 kg). ? Do not play contact sports.  Return to your normal activities as told by your doctor. Ask your doctor what activities are safe. General instructions  To prevent or treat having a hard time pooping (constipation) while you are taking prescription pain medicine, your doctor may recommend that you: ? Drink enough fluid to keep your pee (urine) clear or pale yellow. ? Take over-the-counter or prescription medicines. ? Eat foods that are high in  fiber, such as fresh fruits and vegetables, whole grains, and beans. ? Limit foods that are high in fat and processed sugars, such as fried and sweet foods.  Take over-the-counter and prescription medicines only as told by your doctor.  Do not take baths, swim, or use a hot tub until your doctor says it is okay.  Keep all follow-up visits as told by your doctor. This is important. Contact a doctor if:  You develop a rash.  You have more redness, swelling, or pain around your surgical cut.  You have more fluid or blood coming from your surgical cut.  Your surgical cut feels warm to the touch.  You have pus or a bad smell coming from your surgical cut.  You have a fever or chills.  You have blood in your poop (stool).  You have not pooped in 2-3 days.  Medicine does not help your pain. Get help right away if:  You have chest pain or you are short of breath.  You feel light-headed.  You feel weak and dizzy (feel faint).  You have very bad pain.  You throw up (vomit) and your pain is worse. This information is not intended to replace advice given to you by your health care provider. Make sure you discuss any questions you have with your health care provider. Document Released: 04/29/2014 Document Revised: 10/27/2015 Document Reviewed: 09/20/2015 Elsevier Interactive Patient Education  2019 [place] Anesthesia, Adult General anesthesia is the use of medicines to make a person "go to sleep" (unconscious) for a medical procedure. General anesthesia must be used for certain procedures, and is often recommended for procedures that:  Last a long time.  Require you to be still or in an unusual position.  Are major and can cause blood loss. The medicines used for general anesthesia are called general anesthetics. As well as making you unconscious for a certain amount of time, these medicines:  Prevent pain.  Control your blood pressure.  Relax your  muscles. Tell a health care provider about:  Any allergies you have.  All medicines you are taking, including vitamins, herbs, eye drops, creams, and over-the-counter medicines.  Any problems you or family members have had with anesthetic medicines.  Types of anesthetics you have had in the past.  Any blood disorders you have.  Any surgeries you have had.  Any medical conditions you have.  Any recent upper respiratory,  chest, or ear infections.  Any history of: ? Heart or lung conditions, such as heart failure, sleep apnea, asthma, or chronic obstructive pulmonary disease (COPD). ? [employer] forces logistics/support/administrative officer. ? Depression or anxiety.  Any tobacco or drug use, including marijuana or alcohol use.  Whether you are pregnant or may be pregnant. What are the risks? Generally, this is a safe procedure. However, problems may occur, including:  Allergic reaction.  Lung and heart problems.  Inhaling food or liquid from the stomach into the lungs (aspiration).  Nerve injury.  Dental injury.  Air in the bloodstream, which can lead to stroke.  Extreme agitation or confusion (delirium) when you wake up from the anesthetic.  Waking up during your procedure and being unable to move. This is rare. These problems are more likely to develop if you are having a major surgery or if you have an advanced or serious medical condition. You can prevent some of these complications by answering all of your health care provider's questions thoroughly and by following all instructions before your procedure. General anesthesia can cause side effects, including:  Nausea or vomiting.  A sore throat from the breathing tube.  Hoarseness.  Wheezing or coughing.  Shaking chills.  Tiredness.  Body aches.  Anxiety.  Sleepiness or drowsiness.  Confusion or agitation. What happens before the procedure? Staying hydrated Follow instructions from your health care provider about hydration, which may  include:  Up to 2 hours before the procedure - you may continue to drink clear liquids, such as water, clear fruit juice, black coffee, and plain tea.  Eating and drinking restrictions Follow instructions from your health care provider about eating and drinking, which may include:  8 hours before the procedure - stop eating heavy meals or foods such as meat, fried foods, or fatty foods.  6 hours before the procedure - stop eating light meals or foods, such as toast or cereal.  6 hours before the procedure - stop drinking milk or drinks that contain milk.  2 hours before the procedure - stop drinking clear liquids. Medicines Ask your health care provider about:  Changing or stopping your regular medicines. This is especially important if you are taking diabetes medicines or blood thinners.  Taking medicines such as aspirin and ibuprofen. These medicines can thin your blood. Do not take these medicines unless your health care provider tells you to take them.  Taking over-the-counter medicines, vitamins, herbs, and supplements. Do not take these during the week before your procedure unless your health care provider approves them. General instructions  Starting 3-6 weeks before the procedure, do not use any products that contain nicotine or tobacco, such as cigarettes and e-cigarettes. If you need help quitting, ask your health care provider.  If you brush your teeth on the morning of the procedure, make sure to spit out all of the toothpaste.  Tell your health care provider if you become ill or develop a cold, cough, or fever.  If instructed by your health care provider, bring your sleep apnea device with you on the day of your surgery (if applicable).  Ask your health care provider if you will be going home the same day, the following day, or after a longer hospital stay. ? Plan to have someone take you home from the hospital or clinic. ? Plan to have a responsible adult care for you  for at least 24 hours after you leave the hospital or clinic. This is important. What happens during the procedure?  You will be given anesthetics through both of the following: ? A mask placed over your nose and mouth. ? An IV in one of your veins.  You may receive a medicine to help you relax (sedative).  After you are unconscious, a breathing tube may be inserted down your throat to help you breathe. This will be removed before you wake up.  An anesthesia specialist will stay with you throughout your procedure. He or she will: ? Keep you comfortable and safe by continuing to give you medicines and adjusting the amount of medicine that you get. ? Monitor your blood pressure, pulse, and oxygen levels to make sure that the anesthetics do not cause any problems. The procedure may vary among health care providers and hospitals. What happens after the procedure?  Your blood pressure, temperature, heart rate, breathing rate, and blood oxygen level will be monitored until the medicines you were given have worn off.  You will wake up in a recovery area. You may wake up slowly.  If you feel anxious or agitated, you may be given medicine to help you calm down.  If you will be going home the same day, your health care provider may check to make sure you can walk, drink, and urinate.  Your health care provider will treat any pain or side effects you have before you go home.  Do not drive for 24 hours if you were given a sedative. Summary  General anesthesia is used to keep you still and prevent pain during a procedure.  It is important to tell your health care provider about your medical history and any surgeries you have had, and previous experience with anesthesia.  Follow your health care provider's instructions about when to stop eating, drinking, or taking certain medicines before your procedure.  Plan to have someone take you home from the hospital or clinic. This information is  not intended to replace advice given to you by your health care provider. Make sure you discuss any questions you have with your health care provider. Document Released: 07/16/2007 Document Revised: 08/26/2017 Document Reviewed: 11/22/2016 Elsevier Interactive Patient Education  2019 [place] Anesthesia, Adult, Care After This sheet gives you information about how to care for yourself after your procedure. Your health care provider may also give you more specific instructions. If you have problems or questions, contact your health care provider. What can I expect after the procedure? After the procedure, the following side effects are common:  Pain or discomfort at the IV site.  Nausea.  Vomiting.  Sore throat.  Trouble concentrating.  Feeling cold or chills.  Weak or tired.  Sleepiness and fatigue.  Soreness and body aches. These side effects can affect parts of the body that were not involved in surgery. Follow these instructions at home:  For at least 24 hours after the procedure:  Have a responsible adult stay with you. It is important to have someone help care for you until you are awake and alert.  Rest as needed.  Do not: ? Participate in activities in which you could fall or become injured. ? Drive. ? Use heavy machinery. ? Drink alcohol. ? Take sleeping pills or medicines that cause drowsiness. ? Make important decisions or sign legal documents. ? Take care of children on your own. Eating and drinking  Follow any instructions from your health care provider about eating or drinking restrictions.  When you feel hungry, start by eating small amounts of foods that are soft and  easy to digest (bland), such as toast. Gradually return to your regular diet.  Drink enough fluid to keep your urine pale yellow.  If you vomit, rehydrate by drinking water, juice, or clear broth. General instructions  If you have sleep apnea, surgery and certain medicines  can increase your risk for breathing problems. Follow instructions from your health care provider about wearing your sleep device: ? Anytime you are sleeping, including during daytime naps. ? While taking prescription pain medicines, sleeping medicines, or medicines that make you drowsy.  Return to your normal activities as told by your health care provider. Ask your health care provider what activities are safe for you.  Take over-the-counter and prescription medicines only as told by your health care provider.  If you smoke, do not smoke without supervision.  Keep all follow-up visits as told by your health care provider. This is important. Contact a health care provider if:  You have nausea or vomiting that does not get better with medicine.  You cannot eat or drink without vomiting.  You have pain that does not get better with medicine.  You are unable to pass urine.  You develop a skin rash.  You have a fever.  You have redness around your IV site that gets worse. Get help right away if:  You have difficulty breathing.  You have chest pain.  You have blood in your urine or stool, or you vomit blood. Summary  After the procedure, it is common to have a sore throat or nausea. It is also common to feel tired.  Have a responsible adult stay with you for the first 24 hours after general anesthesia. It is important to have someone help care for you until you are awake and alert.  When you feel hungry, start by eating small amounts of foods that are soft and easy to digest (bland), such as toast. Gradually return to your regular diet.  Drink enough fluid to keep your urine pale yellow.  Return to your normal activities as told by your health care provider. Ask your health care provider what activities are safe for you. This information is not intended to replace advice given to you by your health care provider. Make sure you discuss any questions you have with your health  care provider. Document Released: 07/15/2000 Document Revised: 11/22/2016 Document Reviewed: 11/22/2016 Elsevier Interactive Patient Education  2019 [employer].

## 2018-04-28 ENCOUNTER — Encounter (HOSPITAL_COMMUNITY): Payer: Self-pay

## 2018-04-28 ENCOUNTER — Encounter (HOSPITAL_COMMUNITY)
Admission: RE | Admit: 2018-04-28 | Discharge: 2018-04-28 | Disposition: A | Discharge status: Home / Self Care | Payer: PPO | Source: Ambulatory Visit | Attending: General Surgery | Admitting: General Surgery

## 2018-04-28 ENCOUNTER — Other Ambulatory Visit: Payer: Self-pay

## 2018-04-28 ENCOUNTER — Ambulatory Visit (INDEPENDENT_AMBULATORY_CARE_PROVIDER_SITE_OTHER): Payer: PPO | Admitting: General Surgery

## 2018-04-28 ENCOUNTER — Encounter: Payer: Self-pay | Admitting: General Surgery

## 2018-04-28 VITALS — BP 153/93 | HR 79 | Temp 98.2°F | Resp 18 | Wt 187.2 lb

## 2018-04-28 DIAGNOSIS — Z7982 Long term (current) use of aspirin: Secondary | ICD-10-CM | POA: Diagnosis not present

## 2018-04-28 DIAGNOSIS — Z955 Presence of coronary angioplasty implant and graft: Secondary | ICD-10-CM | POA: Diagnosis not present

## 2018-04-28 DIAGNOSIS — K4090 Unilateral inguinal hernia, without obstruction or gangrene, not specified as recurrent: Secondary | ICD-10-CM | POA: Diagnosis not present

## 2018-04-28 DIAGNOSIS — E785 Hyperlipidemia, unspecified: Secondary | ICD-10-CM | POA: Diagnosis not present

## 2018-04-28 DIAGNOSIS — I252 Old myocardial infarction: Secondary | ICD-10-CM | POA: Diagnosis not present

## 2018-04-28 DIAGNOSIS — E119 Type 2 diabetes mellitus without complications: Secondary | ICD-10-CM | POA: Diagnosis not present

## 2018-04-28 DIAGNOSIS — Z01812 Encounter for preprocedural laboratory examination: Secondary | ICD-10-CM | POA: Insufficient documentation

## 2018-04-28 DIAGNOSIS — Z79899 Other long term (current) drug therapy: Secondary | ICD-10-CM | POA: Diagnosis not present

## 2018-04-28 DIAGNOSIS — I10 Essential (primary) hypertension: Secondary | ICD-10-CM | POA: Diagnosis not present

## 2018-04-28 DIAGNOSIS — F17210 Nicotine dependence, cigarettes, uncomplicated: Secondary | ICD-10-CM | POA: Diagnosis not present

## 2018-04-28 DIAGNOSIS — I2510 Atherosclerotic heart disease of native coronary artery without angina pectoris: Secondary | ICD-10-CM | POA: Diagnosis not present

## 2018-04-28 LAB — BASIC METABOLIC PANEL
ANION GAP: 9 (ref 5–15)
BUN: 16 mg/dL (ref 8–23)
CO2: 25 mmol/L (ref 22–32)
CREATININE: 1.01 mg/dL (ref 0.61–1.24)
Calcium: 9.7 mg/dL (ref 8.9–10.3)
Chloride: 103 mmol/L (ref 98–111)
GLUCOSE: 101 mg/dL — AB (ref 70–99)
Potassium: 4.8 mmol/L (ref 3.5–5.1)
Sodium: 137 mmol/L (ref 135–145)

## 2018-04-28 LAB — CBC
HEMATOCRIT: 49.6 % (ref 39.0–52.0)
Hemoglobin: 15.6 g/dL (ref 13.0–17.0)
MCH: 30.4 pg (ref 26.0–34.0)
MCHC: 31.5 g/dL (ref 30.0–36.0)
MCV: 96.5 fL (ref 80.0–100.0)
NRBC: 0 % (ref 0.0–0.2)
Platelets: 203 10*3/uL (ref 150–400)
RBC: 5.14 MIL/uL (ref 4.22–5.81)
RDW: 12.8 % (ref 11.5–15.5)
WBC: 7.3 10*3/uL (ref 4.0–10.5)

## 2018-04-28 LAB — GLUCOSE, CAPILLARY: Glucose-Capillary: 87 mg/dL (ref 70–99)

## 2018-04-28 LAB — HEMOGLOBIN A1C
Hgb A1c MFr Bld: 5.9 % — ABNORMAL HIGH (ref 4.8–5.6)
Mean Plasma Glucose: 122.63 mg/dL

## 2018-04-28 NOTE — H&P (Addendum)
[name] History and Physical  Reason for Referral: Right inguinal hernia  Referring Physician: Dr. Everett Graff is a 73 y.o. male.  HPI: [name] is a 73 yo known to me with a right inguinal hernia who was recently seen by his cardiology Dr. Angelena Form and was risk stratified and cleared for surgery. He had reported noticing the hernia in September and that it caused him some pain and discomfort with ambulation. He says that the area / bulge has been getting larger and that it remains soft and reducible.  He denies any nausea/vomiting or obstructive symptoms. He stays active hunting and being a member of a hunting club, and the hernia bothers him the most when walking up hills or carrying animals.       Past Medical History:  Diagnosis Date  . CAD, NATIVE VESSEL 12/29/2008   Qualifier: Diagnosis of  By: Angelena Form, MD, Harrell Gave    . CHEST PAIN-UNSPECIFIED 12/24/2008   Qualifier: Diagnosis of  By: Ronne Binning    . Diabetes mellitus without complication ([place])   . DYSLIPIDEMIA 12/24/2008   Qualifier: Diagnosis of  By: Ronne Binning    . Hyperlipidemia   . Hypertension   . MI (myocardial infarction) ([place])   . TOBACCO USER 12/24/2008   Qualifier: Diagnosis of  By: Ronne Binning           Past Surgical History:  Procedure Laterality Date  . CATARACT EXTRACTION Bilateral   . COLONOSCOPY N/A 01/12/2014   Procedure: COLONOSCOPY;  Surgeon: Rogene Houston, MD;  [facility];  Service: Endoscopy;  Laterality: N/A;  1030  . CORONARY ANGIOPLASTY    . HEMORROIDECTOMY    . right middle finger     reattached from accident         Family History  Problem Relation Age of Onset  . Alzheimer's disease Mother   . Heart attack Father     Social History        Tobacco Use  . Smoking status: Current Every Day Smoker    Packs/day: 0.50    Years: 40.00    Pack years: 20.00  . Smokeless tobacco: Never Used    Substance Use Topics  . Alcohol use: Yes    Alcohol/week: 1.0 standard drinks    Types: 1 Cans of beer per week  . Drug use: No    Medications: I have reviewed the patient's current medications. Allergies as of 04/28/2018   No Known Allergies        Medication List       Accurate as of April 28, 2018  9:38 AM. Always use your most recent med list.        acetaminophen 325 MG tablet Commonly known as:  TYLENOL Take 650 mg by mouth every 6 (six) hours as needed for moderate pain.   aspirin EC 81 MG tablet Take 81 mg by mouth daily.   atorvastatin 20 MG tablet Commonly known as:  LIPITOR Take 20 mg by mouth daily.   docusate sodium 100 MG capsule Commonly known as:  COLACE Take 100 mg by mouth daily as needed for mild constipation.   Fish Oil 500 MG Caps Take 500 mg by mouth daily.   ICY HOT EX Apply 1 application topically daily as needed (pain).   lisinopril 20 MG tablet Commonly known as:  PRINIVIL,ZESTRIL Take 20 mg by mouth daily.   meloxicam 15 MG tablet Commonly known as:  MOBIC Take 15 mg by mouth daily as needed (arthritis).   metoprolol tartrate 25 MG tablet Commonly known as:  LOPRESSOR Take 25 mg by mouth daily.   varenicline 0.5 MG X 11 & 1 MG X 42 tablet Commonly known as:  CHANTIX STARTING MONTH PAK Take as directed        ROS:  A comprehensive review of systems was negative except for: Gastrointestinal: positive for reflux symptoms and right inguinal hernia Musculoskeletal: positive for back pain and stiff joints  Blood pressure (!) 153/93, pulse 79, temperature 98.2 F (36.8 C), temperature source Temporal, resp. rate 18, weight 187 lb 3.2 oz (84.9 kg). Physical Exam Vitals signs reviewed.  Constitutional:      Appearance: Normal appearance.  HENT:     Head: Normocephalic and atraumatic.     Nose: Nose normal.     Mouth/Throat:     Mouth: Mucous membranes are moist.  Neck:     Musculoskeletal:  Normal range of motion.  Cardiovascular:     Rate and Rhythm: Normal rate and regular rhythm.  Pulmonary:     Effort: Pulmonary effort is normal.     Breath sounds: Normal breath sounds.  Abdominal:     General: There is no distension.     Palpations: Abdomen is soft. There is no mass.     Tenderness: There is abdominal tenderness in the right lower quadrant. There is no guarding or rebound.     Hernia: A hernia is present. Hernia is present in the right inguinal area.     Comments: Some tenderness at hernia/ scrotum  Musculoskeletal: Normal range of motion.        General: No swelling.  Skin:    General: Skin is warm and dry.  Neurological:     General: No focal deficit present.     Mental Status: He is alert and oriented to person, place, and time. Mental status is at baseline.     Results: LabResultsLast48Hours        Results for orders placed or performed during the hospital encounter of 04/28/18 (from the past 48 hour(s))  Glucose, capillary     Status: None   Collection Time: 04/28/18  8:19 AM  Result Value Ref Range   Glucose-Capillary 87 70 - 99 mg/dL  CBC     Status: None   Collection Time: 04/28/18  8:21 AM  Result Value Ref Range   WBC 7.3 4.0 - 10.5 K/uL   RBC 5.14 4.22 - 5.81 MIL/uL   Hemoglobin 15.6 13.0 - 17.0 g/dL   HCT 49.6 39.0 - 52.0 %   MCV 96.5 80.0 - 100.0 fL   MCH 30.4 26.0 - 34.0 pg   MCHC 31.5 30.0 - 36.0 g/dL   RDW 12.8 11.5 - 15.5 %   Platelets 203 150 - 400 K/uL   nRBC 0.0 0.0 - 0.2 %    Comment: Performed at [facility], [address], [place], [place] [address]  Basic metabolic panel     Status: Abnormal   Collection Time: 04/28/18  8:21 AM  Result Value Ref Range   Sodium 137 135 - 145 mmol/L   Potassium 4.8 3.5 - 5.1 mmol/L   Chloride 103 98 - 111 mmol/L   CO2 25 22 - 32 mmol/L   Glucose, Bld 101 (H) 70 - 99 mg/dL   BUN 16 8 - 23 mg/dL   Creatinine, Ser 1.01 0.61 - 1.24 mg/dL   Calcium 9.7 8.9 - 10.3  mg/dL  GFR calc non Af Amer >60 >60 mL/min   GFR calc Af Amer >60 >60 mL/min   Anion gap 9 5 - 15    Comment: Performed at [facility], [address], [place], [place] [address]      Assessment & Plan:  [name] is a 73 y.o. male with a right inguinal hernia. Doing fair and has been seen by Dr. Angelena Form with no additional workup needed prior to proceeding with his surgery.  -OR for inguinal hernia with mesh  Discussed the risk and benefits including, bleeding, infection, use of mesh, risk of recurrence, risk of nerve damage causing numbness or changes in sensation, risk of damage to the cord structures. The patient understands the risk and benefits of repair with mesh, and has decided to proceed.  We also discussed open versus laparoscopic surgery and the use of mesh. We discussed that I do open repairs with mesh, and that this is considered equivalent to laparoscopic surgery. We discussed reasons for opting for laparoscopic surgery including if a bilateral repair is needed or if a patient has a recurrence after an open repair.     Virl Cagey 04/28/2018, 9:38 AM

## 2018-04-28 NOTE — Progress Notes (Addendum)
[name] History and Physical  Reason for Referral: Right inguinal hernia  Referring Physician: Dr. Everett Lutz is a 73 y.o. male.  HPI: [name] is a 73 yo known to me with a right inguinal hernia who was recently seen by his cardiology Dr. Angelena Lutz and was risk stratified and cleared for surgery. He had reported noticing the hernia in September and that it caused him some pain and discomfort with ambulation. He says that the area / bulge has been getting larger and that it remains soft and reducible.  He denies any nausea/vomiting or obstructive symptoms. He stays active hunting and being a member of a hunting club, and the hernia bothers him the most when walking up hills or carrying animals.   Past Medical History:  Diagnosis Date  . CAD, NATIVE VESSEL 12/29/2008   Qualifier: Diagnosis of  By: Zachary Form, MD, [name]    . CHEST PAIN-UNSPECIFIED 12/24/2008   Qualifier: Diagnosis of  By: [name]    . Diabetes mellitus without complication ([place])   . DYSLIPIDEMIA 12/24/2008   Qualifier: Diagnosis of  By: [name]    . Hyperlipidemia   . Hypertension   . MI (myocardial infarction) ([place])   . TOBACCO USER 12/24/2008   Qualifier: Diagnosis of  By: [name]      Past Surgical History:  Procedure Laterality Date  . CATARACT EXTRACTION Bilateral   . COLONOSCOPY N/A 01/12/2014   Procedure: COLONOSCOPY;  Surgeon: Zachary Houston, MD;  [facility];  Service: Endoscopy;  Laterality: N/A;  1030  . CORONARY ANGIOPLASTY    . HEMORROIDECTOMY    . right middle finger     reattached from accident    Family History  Problem Relation Age of Onset  . Alzheimer's disease Mother   . Heart attack Father     Social History   Tobacco Use  . Smoking status: Current Every Day Smoker    Packs/day: 0.50    Years: 40.00    Pack years: 20.00  . Smokeless tobacco: Never Used  Substance Use Topics  . Alcohol use: Yes   Alcohol/week: 1.0 standard drinks    Types: 1 Cans of beer per week  . Drug use: No    Medications: I have reviewed the patient's current medications. Allergies as of 04/28/2018   No Known Allergies     Medication List       Accurate as of April 28, 2018  9:38 AM. Always use your most recent med list.        acetaminophen 325 MG tablet Commonly known as:  TYLENOL Take 650 mg by mouth every 6 (six) hours as needed for moderate pain.   aspirin EC 81 MG tablet Take 81 mg by mouth daily.   atorvastatin 20 MG tablet Commonly known as:  LIPITOR Take 20 mg by mouth daily.   docusate sodium 100 MG capsule Commonly known as:  COLACE Take 100 mg by mouth daily as needed for mild constipation.   Fish Oil 500 MG Caps Take 500 mg by mouth daily.   ICY HOT EX Apply 1 application topically daily as needed (pain).   lisinopril 20 MG tablet Commonly known as:  PRINIVIL,ZESTRIL Take 20 mg by mouth daily.   meloxicam 15 MG tablet Commonly known as:  MOBIC Take 15 mg by mouth daily as needed (arthritis).   metoprolol tartrate 25 MG tablet Commonly known as:  LOPRESSOR Take 25  mg by mouth daily.   varenicline 0.5 MG X 11 & 1 MG X 42 tablet Commonly known as:  CHANTIX STARTING MONTH PAK Take as directed        ROS:  A comprehensive review of systems was negative except for: Gastrointestinal: positive for reflux symptoms and right inguinal hernia Musculoskeletal: positive for back pain and stiff joints  Blood pressure (!) 153/93, pulse 79, temperature 98.2 F (36.8 C), temperature source Temporal, resp. rate 18, weight 187 lb 3.2 oz (84.9 kg). Physical Exam Vitals signs reviewed.  Constitutional:      Appearance: Normal appearance.  HENT:     Head: Normocephalic and atraumatic.     Nose: Nose normal.     Mouth/Throat:     Mouth: Mucous membranes are moist.  Neck:     Musculoskeletal: Normal range of motion.  Cardiovascular:     Rate and Rhythm: Normal rate and  regular rhythm.  Pulmonary:     Effort: Pulmonary effort is normal.     Breath sounds: Normal breath sounds.  Abdominal:     General: There is no distension.     Palpations: Abdomen is soft. There is no mass.     Tenderness: There is abdominal tenderness in the right lower quadrant. There is no guarding or rebound.     Hernia: A hernia is present. Hernia is present in the right inguinal area.     Comments: Some tenderness at hernia/ scrotum  Musculoskeletal: Normal range of motion.        General: No swelling.  Skin:    General: Skin is warm and dry.  Neurological:     General: No focal deficit present.     Mental Status: He is alert and oriented to person, place, and time. Mental status is at baseline.     Results: Results for orders placed or performed during the hospital encounter of 04/28/18 (from the past 48 hour(s))  Glucose, capillary     Status: None   Collection Time: 04/28/18  8:19 AM  Result Value Ref Range   Glucose-Capillary 87 70 - 99 mg/dL  CBC     Status: None   Collection Time: 04/28/18  8:21 AM  Result Value Ref Range   WBC 7.3 4.0 - 10.5 K/uL   RBC 5.14 4.22 - 5.81 MIL/uL   Hemoglobin 15.6 13.0 - 17.0 g/dL   HCT 49.6 39.0 - 52.0 %   MCV 96.5 80.0 - 100.0 fL   MCH 30.4 26.0 - 34.0 pg   MCHC 31.5 30.0 - 36.0 g/dL   RDW 12.8 11.5 - 15.5 %   Platelets 203 150 - 400 K/uL   nRBC 0.0 0.0 - 0.2 %    Comment: Performed at [facility], [address] [place], [place] [address]  Basic metabolic panel     Status: Abnormal   Collection Time: 04/28/18  8:21 AM  Result Value Ref Range   Sodium 137 135 - 145 mmol/L   Potassium 4.8 3.5 - 5.1 mmol/L   Chloride 103 98 - 111 mmol/L   CO2 25 22 - 32 mmol/L   Glucose, Bld 101 (H) 70 - 99 mg/dL   BUN 16 8 - 23 mg/dL   Creatinine, Ser 1.01 0.61 - 1.24 mg/dL   Calcium 9.7 8.9 - 10.3 mg/dL   GFR calc non Af Amer >60 >60 mL/min   GFR calc Af Amer >60 >60 mL/min   Anion gap 9 5 - 15    Comment: Performed at  [facility] [place], [place] [address]    Assessment & Plan:  [name] is a 73 y.o. male with a right inguinal hernia. Doing fair and has been seen by Dr. Angelena Lutz with no additional workup needed prior to proceeding with his surgery.  -OR for inguinal hernia with mesh  Discussed the risk and benefits including, bleeding, infection, use of mesh, risk of recurrence, risk of nerve damage causing numbness or changes in sensation, risk of damage to the cord structures. The patient understands the risk and benefits of repair with mesh, and has decided to proceed.  We also discussed open versus laparoscopic surgery and the use of mesh. We discussed that I do open repairs with mesh, and that this is considered equivalent to laparoscopic surgery. We discussed reasons for opting for laparoscopic surgery including if a bilateral repair is needed or if a patient has a recurrence after an open repair.     Zachary Cagey 04/28/2018, 9:38 AM

## 2018-04-28 NOTE — Patient Instructions (Signed)
Open Hernia Repair, Adult  Open hernia repair is a surgical procedure to fix a hernia. A hernia occurs when an internal organ or tissue pushes out through a weak spot in the abdominal wall muscles. Hernias commonly occur in the groin and around the navel. Most hernias tend to get worse over time. Often, surgery is done to prevent the hernia from becoming bigger, uncomfortable, or an emergency. Emergency surgery may be needed if abdominal contents get stuck in the opening (incarcerated hernia) or the blood supply gets cut off (strangulated hernia). In an open repair, an incision is made in the abdomen to perform the surgery. Tell a health care provider about:  Any allergies you have.  All medicines you are taking, including vitamins, herbs, eye drops, creams, and over-the-counter medicines.  Any problems you or family members have had with anesthetic medicines.  Any blood or bone disorders you have.  Any surgeries you have had.  Any medical conditions you have, including any recent cold or flu symptoms.  Whether you are pregnant or may be pregnant. What are the risks? Generally, this is a safe procedure. However, problems may occur, including:  Long-lasting (chronic) pain.  Bleeding.  Infection.  Damage to the testicle. This can cause shrinking or swelling.  Damage to the bladder, blood vessels, intestine, or nerves near the hernia.  Trouble passing urine.  Allergic reactions to medicines.  Return of the hernia. Medicines  Ask your health care provider about: ? Changing or stopping your regular medicines. This is especially important if you are taking diabetes medicines or blood thinners. ? Taking medicines such as aspirin and ibuprofen. These medicines can thin your blood. Do not take these medicines before your procedure if your health care provider instructs you not to.  You may be given antibiotic medicine to help prevent infection. General instructions  You may have  blood tests or imaging studies.  Ask your health care provider how your surgical site will be marked or identified.  If you smoke, do not smoke for at least 2 weeks before your procedure or for as long as told by your health care provider.  Let your health care provider know if you develop a cold or any infection before your surgery.  Plan to have someone take you home from the hospital or clinic.  If you will be going home right after the procedure, plan to have someone with you for 24 hours. What happens during the procedure?  To reduce your risk of infection: ? Your health care team will wash or sanitize their hands. ? Your skin will be washed with soap. ? Hair may be removed from the surgical area.  An IV tube will be inserted into one of your veins.  You will be given one or more of the following: ? A medicine to help you relax (sedative). ? A medicine to numb the area (local anesthetic). ? A medicine to make you fall asleep (general anesthetic).  Your surgeon will make an incision over the hernia.  The tissues of the hernia will be moved back into place.  The edges of the hernia may be stitched together.  The opening in the abdominal muscles will be closed with stitches (sutures). Or, your surgeon will place a mesh patch made of manmade (synthetic) material over the opening.  The incision will be closed.  A bandage (dressing) may be placed over the incision. The procedure may vary among health care providers and hospitals. What happens after the procedure?    Your blood pressure, heart rate, breathing rate, and blood oxygen level will be monitored until the medicines you were given have worn off.  You may be given medicine for pain.  Do not drive for 24 hours if you received a sedative. This information is not intended to replace advice given to you by your health care provider. Make sure you discuss any questions you have with your health care provider. Document  Released: 10/02/2000 Document Revised: 10/27/2015 Document Reviewed: 09/20/2015 Elsevier Interactive Patient Education  2019 Else[employer]. Inguinal Hernia, Adult An inguinal hernia is when fat or your intestines push through a weak spot in a muscle where your leg meets your lower belly (groin). This causes a rounded lump (bulge). This kind of hernia could also be:  In your scrotum, if you are male.  In folds of skin around your vagina, if you are male. There are three types of inguinal hernias. These include:  Hernias that can be pushed back into the belly (are reducible). This type rarely causes pain.  Hernias that cannot be pushed back into the belly (are incarcerated).  Hernias that cannot be pushed back into the belly and lose their blood supply (are strangulated). This type needs emergency surgery. If you do not have symptoms, you may not need treatment. If you have symptoms or a large hernia, you may need surgery. Follow these instructions at home: Lifestyle  Do these things if told by your doctor so you do not have trouble pooping (constipation): ? Drink enough fluid to keep your pee (urine) pale yellow. ? Eat foods that have a lot of fiber. These include fresh fruits and vegetables, whole grains, and beans. ? Limit foods that are high in fat and processed sugars. These include foods that are fried or sweet. ? Take medicine for trouble pooping.  Avoid lifting heavy objects.  Avoid standing for long amounts of time.  Do not use any products that contain nicotine or tobacco. These include cigarettes and e-cigarettes. If you need help quitting, ask your doctor.  Stay at a healthy weight. General instructions  You may try to push your hernia in by very gently pressing on it when you are lying down. Do not try to force the bulge back in if it will not push in easily.  Watch your hernia for any changes in shape, size, or color. Tell your doctor if you see any changes.  Take  over-the-counter and prescription medicines only as told by your doctor.  Keep all follow-up visits as told by your doctor. This is important. Contact a doctor if:  You have a fever.  You have new symptoms.  Your symptoms get worse. Get help right away if:  The area where your leg meets your lower belly has: ? Pain that gets worse suddenly. ? A bulge that gets bigger suddenly, and it does not get smaller after that. ? A bulge that turns red or purple. ? A bulge that is painful when you touch it.  You are a man, and your scrotum: ? Suddenly feels painful. ? Suddenly changes in size.  You cannot push the hernia in by very gently pressing on it when you are lying down. Do not try to force the bulge back in if it will not push in easily.  You feel sick to your stomach (nauseous), and that feeling does not go away.  You throw up (vomit), and that keeps happening.  You have a fast heartbeat.  You cannot poop (have a   bowel movement) or pass gas. These symptoms may be an emergency. Do not wait to see if the symptoms will go away. Get medical help right away. Call your local emergency services (911 in the [place] Summary  An inguinal hernia is when fat or your intestines push through a weak spot in a muscle where your leg meets your lower belly (groin). This causes a rounded lump (bulge).  If you do not have symptoms, you may not need treatment. If you have symptoms or a large hernia, you may need surgery.  Avoid lifting heavy objects. Also avoid standing for long amounts of time.  Do not try to force the bulge back in if it will not push in easily. This information is not intended to replace advice given to you by your health care provider. Make sure you discuss any questions you have with your health care provider. Document Released: 05/09/2006 Document Revised: 05/10/2017 Document Reviewed: 01/08/2017 Elsevier Interactive Patient Education  2019 [employer]vier Inc[employer]  

## 2018-04-28 NOTE — Pre-Procedure Instructions (Signed)
HgbA1C routed to PCP. 

## 2018-05-01 ENCOUNTER — Ambulatory Visit (HOSPITAL_COMMUNITY): Payer: PPO | Admitting: Anesthesiology

## 2018-05-01 ENCOUNTER — Ambulatory Visit (HOSPITAL_COMMUNITY)
Admission: RE | Admit: 2018-05-01 | Discharge: 2018-05-01 | Disposition: A | Discharge status: Home / Self Care | Payer: PPO | Attending: General Surgery | Admitting: General Surgery

## 2018-05-01 ENCOUNTER — Encounter (HOSPITAL_COMMUNITY)
Admission: RE | Disposition: A | Discharge status: Home / Self Care | Payer: Self-pay | Source: Home / Self Care | Attending: General Surgery

## 2018-05-01 DIAGNOSIS — Z7982 Long term (current) use of aspirin: Secondary | ICD-10-CM | POA: Insufficient documentation

## 2018-05-01 DIAGNOSIS — E785 Hyperlipidemia, unspecified: Secondary | ICD-10-CM | POA: Diagnosis not present

## 2018-05-01 DIAGNOSIS — I10 Essential (primary) hypertension: Secondary | ICD-10-CM | POA: Insufficient documentation

## 2018-05-01 DIAGNOSIS — F17210 Nicotine dependence, cigarettes, uncomplicated: Secondary | ICD-10-CM | POA: Insufficient documentation

## 2018-05-01 DIAGNOSIS — Z79899 Other long term (current) drug therapy: Secondary | ICD-10-CM | POA: Diagnosis not present

## 2018-05-01 DIAGNOSIS — Z955 Presence of coronary angioplasty implant and graft: Secondary | ICD-10-CM | POA: Diagnosis not present

## 2018-05-01 DIAGNOSIS — E119 Type 2 diabetes mellitus without complications: Secondary | ICD-10-CM | POA: Diagnosis not present

## 2018-05-01 DIAGNOSIS — I2510 Atherosclerotic heart disease of native coronary artery without angina pectoris: Secondary | ICD-10-CM | POA: Diagnosis not present

## 2018-05-01 DIAGNOSIS — F17200 Nicotine dependence, unspecified, uncomplicated: Secondary | ICD-10-CM | POA: Diagnosis not present

## 2018-05-01 DIAGNOSIS — K4090 Unilateral inguinal hernia, without obstruction or gangrene, not specified as recurrent: Secondary | ICD-10-CM | POA: Diagnosis not present

## 2018-05-01 DIAGNOSIS — I252 Old myocardial infarction: Secondary | ICD-10-CM | POA: Insufficient documentation

## 2018-05-01 HISTORY — PX: INGUINAL HERNIA REPAIR: SHX194

## 2018-05-01 LAB — GLUCOSE, CAPILLARY: Glucose-Capillary: 100 mg/dL — ABNORMAL HIGH (ref 70–99)

## 2018-05-01 SURGERY — REPAIR, HERNIA, INGUINAL, ADULT
Anesthesia: General | Site: Inguinal | Laterality: Right

## 2018-05-01 MED ORDER — LACTATED RINGERS IV SOLN: INTRAVENOUS | Status: DC

## 2018-05-01 MED ORDER — LACTATED RINGERS IV SOLN
INTRAVENOUS | Status: DC
  Administered 2018-05-01: 07:00:00 via INTRAVENOUS

## 2018-05-01 MED ORDER — SODIUM CHLORIDE 0.9% FLUSH
INTRAVENOUS | Status: AC
  Filled 2018-05-01: qty 10

## 2018-05-01 MED ORDER — PHENYLEPHRINE HCL 10 MG/ML IJ SOLN
INTRAMUSCULAR | Status: DC | PRN
  Administered 2018-05-01: 40 ug via INTRAVENOUS
  Administered 2018-05-01: 80 ug via INTRAVENOUS
  Administered 2018-05-01: 40 ug via INTRAVENOUS
  Administered 2018-05-01: 80 ug via INTRAVENOUS
  Administered 2018-05-01: 40 ug via INTRAVENOUS

## 2018-05-01 MED ORDER — CHLORHEXIDINE GLUCONATE CLOTH 2 % EX PADS: 6.0000 | MEDICATED_PAD | Freq: Once | CUTANEOUS | Status: DC

## 2018-05-01 MED ORDER — MEPERIDINE HCL 50 MG/ML IJ SOLN: 6.2500 mg | INTRAMUSCULAR | Status: DC | PRN

## 2018-05-01 MED ORDER — KETOROLAC TROMETHAMINE 30 MG/ML IJ SOLN
INTRAMUSCULAR | Status: AC
  Filled 2018-05-01: qty 1

## 2018-05-01 MED ORDER — PROPOFOL 10 MG/ML IV BOLUS
INTRAVENOUS | Status: DC | PRN
  Administered 2018-05-01: 130 mg via INTRAVENOUS
  Administered 2018-05-01: 30 mg via INTRAVENOUS

## 2018-05-01 MED ORDER — HYDROMORPHONE HCL 1 MG/ML IJ SOLN
0.2500 mg | INTRAMUSCULAR | Status: DC | PRN
  Administered 2018-05-01 (×4): 0.5 mg via INTRAVENOUS
  Filled 2018-05-01 (×4): qty 0.5

## 2018-05-01 MED ORDER — HYDROCODONE-ACETAMINOPHEN 7.5-325 MG PO TABS: 1.0000 | ORAL_TABLET | Freq: Once | ORAL | Status: DC | PRN

## 2018-05-01 MED ORDER — BUPIVACAINE IN DEXTROSE 0.75-8.25 % IT SOLN
INTRATHECAL | Status: AC
  Filled 2018-05-01: qty 2

## 2018-05-01 MED ORDER — EPHEDRINE SULFATE 50 MG/ML IJ SOLN
INTRAMUSCULAR | Status: DC | PRN
  Administered 2018-05-01: 5 mg via INTRAVENOUS

## 2018-05-01 MED ORDER — PROMETHAZINE HCL 25 MG/ML IJ SOLN
6.2500 mg | INTRAMUSCULAR | Status: DC | PRN
  Administered 2018-05-01: 6.25 mg via INTRAVENOUS
  Filled 2018-05-01: qty 1

## 2018-05-01 MED ORDER — DOCUSATE SODIUM 100 MG PO CAPS: 100.0000 mg | ORAL_CAPSULE | Freq: Two times a day (BID) | ORAL | 1 refills | Status: AC

## 2018-05-01 MED ORDER — KETOROLAC TROMETHAMINE 30 MG/ML IJ SOLN
30.0000 mg | Freq: Once | INTRAMUSCULAR | Status: AC
  Administered 2018-05-01: 30 mg via INTRAVENOUS

## 2018-05-01 MED ORDER — SODIUM CHLORIDE 0.9 % IR SOLN
Status: DC | PRN
  Administered 2018-05-01: 1000 mL

## 2018-05-01 MED ORDER — BUPIVACAINE LIPOSOME 1.3 % IJ SUSP
INTRAMUSCULAR | Status: AC
  Filled 2018-05-01: qty 20

## 2018-05-01 MED ORDER — OXYCODONE HCL 5 MG PO TABS: 5.0000 mg | ORAL_TABLET | ORAL | 0 refills | Status: DC | PRN

## 2018-05-01 MED ORDER — BUPIVACAINE LIPOSOME 1.3 % IJ SUSP
INTRAMUSCULAR | Status: DC | PRN
  Administered 2018-05-01: 20 mL

## 2018-05-01 MED ORDER — FENTANYL CITRATE (PF) 100 MCG/2ML IJ SOLN
INTRAMUSCULAR | Status: AC
  Filled 2018-05-01: qty 2

## 2018-05-01 MED ORDER — PROPOFOL 10 MG/ML IV BOLUS
INTRAVENOUS | Status: AC
  Filled 2018-05-01: qty 40

## 2018-05-01 MED ORDER — FENTANYL CITRATE (PF) 100 MCG/2ML IJ SOLN
INTRAMUSCULAR | Status: DC | PRN
  Administered 2018-05-01 (×2): 25 ug via INTRAVENOUS

## 2018-05-01 MED ORDER — CEFAZOLIN SODIUM-DEXTROSE 2-4 GM/100ML-% IV SOLN
2.0000 g | INTRAVENOUS | Status: AC
  Administered 2018-05-01: 2 g via INTRAVENOUS
  Filled 2018-05-01: qty 100

## 2018-05-01 MED ORDER — PHENYLEPHRINE 40 MCG/ML (10ML) SYRINGE FOR IV PUSH (FOR BLOOD PRESSURE SUPPORT)
PREFILLED_SYRINGE | INTRAVENOUS | Status: AC
  Filled 2018-05-01: qty 10

## 2018-05-01 SURGICAL SUPPLY — 42 items
ADH SKN CLS APL DERMABOND .7 (GAUZE/BANDAGES/DRESSINGS) ×1
CLOTH BEACON ORANGE TIMEOUT ST (SAFETY) ×3 IMPLANT
COVER LIGHT HANDLE STERIS (MISCELLANEOUS) ×6 IMPLANT
COVER WAND RF STERILE (DRAPES) ×2 IMPLANT
DERMABOND ADVANCED (GAUZE/BANDAGES/DRESSINGS) ×2
DERMABOND ADVANCED .7 DNX12 (GAUZE/BANDAGES/DRESSINGS) ×1 IMPLANT
DRAIN PENROSE 18X1/2 LTX STRL (DRAIN) ×3 IMPLANT
ELECT REM PT RETURN 9FT ADLT (ELECTROSURGICAL) ×3
ELECTRODE REM PT RTRN 9FT ADLT (ELECTROSURGICAL) ×1 IMPLANT
GLOVE BIO SURGEON STRL SZ 6.5 (GLOVE) ×2 IMPLANT
GLOVE BIO SURGEON STRL SZ7 (GLOVE) ×4 IMPLANT
GLOVE BIO SURGEONS STRL SZ 6.5 (GLOVE) ×1
GLOVE BIOGEL PI IND STRL 6.5 (GLOVE) ×1 IMPLANT
GLOVE BIOGEL PI IND STRL 7.0 (GLOVE) ×2 IMPLANT
GLOVE BIOGEL PI INDICATOR 6.5 (GLOVE) ×2
GLOVE BIOGEL PI INDICATOR 7.0 (GLOVE) ×6
GLOVE ECLIPSE 6.5 STRL STRAW (GLOVE) ×2 IMPLANT
GOWN STRL REUS W/ TWL XL LVL3 (GOWN DISPOSABLE) ×1 IMPLANT
GOWN STRL REUS W/TWL LRG LVL3 (GOWN DISPOSABLE) ×6 IMPLANT
GOWN STRL REUS W/TWL XL LVL3 (GOWN DISPOSABLE) ×3
INST SET MINOR GENERAL (KITS) ×3 IMPLANT
KIT TURNOVER KIT A (KITS) ×3 IMPLANT
MANIFOLD NEPTUNE II (INSTRUMENTS) ×3 IMPLANT
MESH MARLEX PLUG MEDIUM (Mesh General) ×2 IMPLANT
NDL HYPO 21X1.5 SAFETY (NEEDLE) ×1 IMPLANT
NEEDLE HYPO 21X1.5 SAFETY (NEEDLE) ×3 IMPLANT
NS IRRIG 1000ML POUR BTL (IV SOLUTION) ×3 IMPLANT
PACK MINOR (CUSTOM PROCEDURE TRAY) ×3 IMPLANT
PAD ARMBOARD 7.5X6 YLW CONV (MISCELLANEOUS) ×3 IMPLANT
SET BASIN LINEN APH (SET/KITS/TRAYS/PACK) ×3 IMPLANT
SUT NOVA NAB GS-22 2 2-0 T-19 (SUTURE) ×8 IMPLANT
SUT PROLENE 2 0 SH 30 (SUTURE) IMPLANT
SUT SILK 3 0 (SUTURE)
SUT SILK 3-0 18XBRD TIE 12 (SUTURE) IMPLANT
SUT VIC AB 2-0 CT1 27 (SUTURE) ×3
SUT VIC AB 2-0 CT1 TAPERPNT 27 (SUTURE) ×1 IMPLANT
SUT VIC AB 3-0 SH 27 (SUTURE) ×12
SUT VIC AB 3-0 SH 27X BRD (SUTURE) ×1 IMPLANT
SUT VIC AB 4-0 PS2 27 (SUTURE) ×3 IMPLANT
SUT VICRYL AB 3 0 TIES (SUTURE) IMPLANT
SYR 20CC LL (SYRINGE) ×3 IMPLANT
SYR 30ML LL (SYRINGE) ×2 IMPLANT

## 2018-05-01 NOTE — Anesthesia Preprocedure Evaluation (Signed)
Anesthesia Evaluation    Airway Mallampati: II       Dental  (+) Dental Advidsory Given   Pulmonary Current Smoker,    breath sounds clear to auscultation       Cardiovascular hypertension, + CAD and + Past MI   Rhythm:regular     Neuro/Psych    GI/Hepatic   Endo/Other  diabetes, Type 2  Renal/GU      Musculoskeletal   Abdominal   Peds  Hematology   Anesthesia Other Findings angiolplasty with STENTS Ongoing tobacco abuse  Reproductive/Obstetrics                             Anesthesia Physical Anesthesia Plan  ASA: III  Anesthesia Plan: General   Post-op Pain Management:    Induction:   PONV Risk Score and Plan:   Airway Management Planned:   Additional Equipment:   Intra-op Plan:   Post-operative Plan:   Informed Consent: I have reviewed the patients History and Physical, chart, labs and discussed the procedure including the risks, benefits and alternatives for the proposed anesthesia with the patient or authorized representative who has indicated his/her understanding and acceptance.     Plan Discussed with: Anesthesiologist  Anesthesia Plan Comments:         Anesthesia Quick Evaluation

## 2018-05-01 NOTE — Anesthesia Postprocedure Evaluation (Signed)
Anesthesia Post Note  Patient: [name]  Procedure(s) Performed: RIGHT INGUINAL HERNIA REPAIR WITH MESH (Right Inguinal)  Patient location during evaluation: PACU Anesthesia Type: General Level of consciousness: awake and alert and patient cooperative Pain management: satisfactory to patient Vital Signs Assessment: post-procedure vital signs reviewed and stable Respiratory status: spontaneous breathing Cardiovascular status: stable Postop Assessment: no apparent nausea or vomiting Anesthetic complications: no     Last Vitals:  Vitals:   05/01/18 0930 05/01/18 0945  BP: 115/74 117/90  Pulse:  77  Resp: 14 (!) 27  Temp:    SpO2:  100%    Last Pain:  Vitals:   05/01/18 0650  TempSrc: Oral  PainSc: 2                  Ambermarie Honeyman

## 2018-05-01 NOTE — Op Note (Signed)
[name] Operative Note  05/01/18  Preoperative Diagnosis: Right inguinal hernia    Postoperative Diagnosis: Right inguinal hernia, sliding type    Procedure(s) Performed: Right inguinal hernia repair with mesh   Surgeon: Zachary Matar. Constance Haw, MD   Assistants: No qualified resident was available   Anesthesia: General endotracheal   Anesthesiologist: Vena Rua, MD    Specimens: None   Estimated Blood Loss: Minimal   Blood Replacement: None    Complications: None   Wound Class: Clean    Operative Indications: [name] is a very pleasant 73 yo with a right inguinal hernia that was causing him discomfort and he wanted to get repaired.  He is active and still hunts, and wants to stay active.  We discussed the risk and benefits of the procedure including but not limited to bleeding, infection, use of mesh, risk of nerve injury/ entrapment, risk of testicular cord injury, and he opted to proceed.   Findings: Sliding inguinal hernia with thin hernia sac    Procedure: The patient was taken to the operating room and placed supine. General endotracheal anesthesia was induced. Intravenous antibiotics were  administered per protocol.  A time out was preformed verifying the correct patient, procedure, site, positioning and implants.  The right groin and scrotum were prepared and draped in the usual sterile fashion.   An incision was made in a natural skin crease between the pubic tubercle and the anterior superior iliac spine.  The incision was deepened with electrocautery through Scarpa's and Camper's fascia until the aponeurosis of the external oblique was encountered.  This was cleaned and the external ring was exposed.  An incision was made in the midportion of the external oblique aponeurosis in the direction of its fibers. The ilioinguinal nerve was kept out of the field and protected. Flaps of the external oblique were developed cephalad and inferiorly.     The cord was identified and it was gently dissented free at the pubic tubercle and encircled with a Penrose drain.  Attention was then directed at the anteromedial aspect of the cord, where an indirect hernia sac was identified.  The sac was carefully dissected but was so thin that stripping down the cremasteric muscle resulted in tearing the sac.  I confirmed entry into the peritoneal cavity, and grasped the side of the sac with an Alis clamp. After careful dissection the sac was freed from the cord down to the level of the internal ring.  The vas and testicular vessels were identified and protected from harm.  Once the sac was dissected free from the cords, the Penrose was placed around the cord which was retracted inferiorly out of the field of view.  The sac was noted to have a sliding type hernia as part of the small bowel was adherent to the sac. Care was taken to close the sac with a 3-0 Vicryl suture ligation and ensuring that the bowel was protected and out of the closure.  The hernia was reduced into the internal ring without difficulty.  A medium Perfix Plug was placed into the defect and filled the space.  Attention was then turned to the floor of the canal, which was grossly weakened without any defined defect or sac.  The Perfix Mesh Patch was sutured to the inguinal ligament inferiorly starting at the pubic tubercle using 2-0 Novafil interrupted sutures.  The mesh was sutured superiorly to the conjoint tendon using 2-0 Novafil interrupted sutures.  Care was taken to ensure the mesh  was placed in a relaxed fashion to avoid excessive tension and no neurovascular structures were caught in the repair.  Laterally the tails of the mesh were crossed and the internal ring was recreated, allowing for passage of cords without tension.   Hemostasis was adequate.  The Penrose was removed.  The external oblique aponeurosis was closed with a 2-0 Vicryl suture in a running fashion, taking care to not catch the  ilioinguinal nerve in the suture line.  Scarpa's fashion was closed with 3-0 Vicryl interrupted sutures. The skin was closed with a subcuticular 4-0 Monocryl suture.  Dermabond was applied.   The testis was gently pulled down into its anatomic position in the scrotum.  The patient tolerated the procedure well and was taken to the PACU in stable condition. All counts were correct at the end of the case.        [name], MD [facility] [address] [place], [place] [address] [phone] (office)

## 2018-05-01 NOTE — Anesthesia Procedure Notes (Signed)
Procedure Name: LMA Insertion Date/Time: 05/01/2018 7:37 AM Performed by: Vista Deck, CRNA Pre-anesthesia Checklist: Patient identified, Patient being monitored, Emergency Drugs available, Timeout performed and Suction available Patient Re-evaluated:Patient Re-evaluated prior to induction Oxygen Delivery Method: Circle System Utilized Preoxygenation: Pre-oxygenation with 100% oxygen Induction Type: IV induction Ventilation: Mask ventilation without difficulty LMA: LMA inserted LMA Size: 4.0 Number of attempts: 1 Placement Confirmation: positive ETCO2 and breath sounds checked- equal and bilateral Tube secured with: Tape Dental Injury: Teeth and Oropharynx as per pre-operative assessment

## 2018-05-01 NOTE — Transfer of Care (Signed)
Immediate Anesthesia Transfer of Care Note  Patient: [name]  Procedure(s) Performed: RIGHT INGUINAL HERNIA REPAIR WITH MESH (Right Inguinal)  Patient Location: PACU  Anesthesia Type:General  Level of Consciousness: awake and patient cooperative  Airway & Oxygen Therapy: Patient Spontanous Breathing and non-rebreather face mask  Post-op Assessment: Report given to RN and Post -op Vital signs reviewed and stable  Post vital signs: Reviewed and stable  Last Vitals:  Vitals Value Taken Time  BP 118/80 05/01/2018  9:16 AM  Temp    Pulse 36 05/01/2018  9:17 AM  Resp 18 05/01/2018  9:18 AM  SpO2 100 % 05/01/2018  9:17 AM  Vitals shown include unvalidated device data.  Last Pain:  Vitals:   05/01/18 0650  TempSrc: Oral  PainSc: 2       Patients Stated Pain Goal: 6 (71/24/58 0998)  Complications: No apparent anesthesia complications

## 2018-05-01 NOTE — Discharge Instructions (Signed)
Discharge Open Abdominal Surgery Instructions:  Common Complaints: Pain at the incision site is common. This will improve with time. Take your pain medications as described below. Some nausea is common and poor appetite. The main goal is to stay hydrated the first few days after surgery.  If you start to experience any burning/ pins and needle type pain, call as we can prescribe a different type of pain medication that helps with nerve type pain.   Diet/ Activity: Diet as tolerated.  You may not have a large appetite, but it is important to stay hydrated. Drink 64 ounces of water a day. Your appetite will return with time after anesthesia.  Keep your incision dry and do not pick at the dermabond glue.   Shower per your regular routine daily.  Do not take hot showers. Take warm showers that are less than 10 minutes. Path the incision dry. Rest and listen to your body, but do not remain in bed all day.  Walk everyday for at least 15-20 minutes. Deep cough and move around every 1-2 hours in the first few days after surgery.  Do not lift > 10 lbs, perform excessive bending, pushing, pulling, squatting for 6-8 weeks after surgery.  The activity restrictions are to prevent hernia formation at your incision while you are healing.  Do not place lotions or balms on your incision unless instructed to specifically by Dr. Constance Haw.   Medication: Take tylenol and ibuprofen as needed for pain control, alternating every 4-6 hours.  Example:  Tylenol 1000mg  @ 6am, 12noon, 6pm, 87midnight (Do not exceed 4000mg  of tylenol a day). Ibuprofen 800mg  @ 9am, 3pm, 9pm, 3am (Do not exceed 3600mg  of ibuprofen a day).  Take Roxicodone for breakthrough pain every 4 hours.  Take Colace for constipation related to narcotic pain medication. If you do not have a bowel movement in 2 days, take Miralax over the counter.  Drink plenty of water to also prevent constipation.   Contact Information: If you have questions or  concerns, please call our office, [phone], Monday- Thursday 8AM-5PM and Friday 8AM-12Noon.  If it is after hours or on the weekend, please call [facility] Main Number, [phone], and ask to speak to the surgeon on call for Dr. Constance Haw at [facility].    Open Hernia Repair, Adult, Care After These instructions give you information about caring for yourself after your procedure. Your doctor may also give you more specific instructions. If you have problems or questions, contact your doctor. Follow these instructions at home: Surgical cut (incision) care   Follow instructions from your doctor about how to take care of your surgical cut area. Make sure you: ? Leave skin glue in place. They may need to stay in place for 2 weeks or longer. ? You may shower.   Check your surgical cut every day for signs of infection. Check for: ? More redness, swelling, or pain. ? More fluid or blood. ? Warmth. ? Pus or a bad smell. Activity  Do not drive or use heavy machinery while taking prescription pain medicine. Do not drive until you are off pain medication and can slam on the breaks without problem.   Until your doctor says it is okay: ? Do not lift anything that is heavier than 10 lb (4.5 kg). ? Do not play contact sports.  Return to your normal activities as told by your doctor. Ask your doctor what activities are safe. General instructions  To prevent or treat having a hard time pooping (  constipation) while you are taking prescription pain medicine, your doctor may recommend that you: ? Drink enough fluid to keep your pee (urine) clear or pale yellow. ? Take over-the-counter or prescription medicines. ? Eat foods that are high in fiber, such as fresh fruits and vegetables, whole grains, and beans. ? Limit foods that are high in fat and processed sugars, such as fried and sweet foods.  Take over-the-counter and prescription medicines only as told by your doctor.  Do not take baths, swim,  or use a hot tub until your doctor says it is okay.  Keep all follow-up visits as told by your doctor. This is important. Contact a doctor if:  You develop a rash.  You have more redness, swelling, or pain around your surgical cut.  You have more fluid or blood coming from your surgical cut.  Your surgical cut feels warm to the touch.  You have pus or a bad smell coming from your surgical cut.  You have a fever or chills.  You have blood in your poop (stool).  You have not pooped in 2-3 days.  Medicine does not help your pain. Get help right away if:  You have chest pain or you are short of breath.  You feel light-headed.  You feel weak and dizzy (feel faint).  You have very bad pain.  You throw up (vomit) and your pain is worse. This information is not intended to replace advice given to you by your health care provider. Make sure you discuss any questions you have with your health care provider. Document Released: 04/29/2014 Document Revised: 10/27/2015 Document Reviewed: 09/20/2015 Elsevier Interactive Patient Education  2019 [employer] Anesthesia, Adult, Care After This sheet gives you information about how to care for yourself after your procedure. Your health care provider may also give you more specific instructions. If you have problems or questions, contact your health care provider. What can I expect after the procedure? After the procedure, the following side effects are common:  Pain or discomfort at the IV site.  Nausea.  Vomiting.  Sore throat.  Trouble concentrating.  Feeling cold or chills.  Weak or tired.  Sleepiness and fatigue.  Soreness and body aches. These side effects can affect parts of the body that were not involved in surgery. Follow these instructions at home:  For at least 24 hours after the procedure:  Have a responsible adult stay with you. It is important to have someone help care for you until you are  awake and alert.  Rest as needed.  Do not: ? Participate in activities in which you could fall or become injured. ? Drive. ? Use heavy machinery. ? Drink alcohol. ? Take sleeping pills or medicines that cause drowsiness. ? Make important decisions or sign legal documents. ? Take care of children on your own. Eating and drinking  Follow any instructions from your health care provider about eating or drinking restrictions.  When you feel hungry, start by eating small amounts of foods that are soft and easy to digest (bland), such as toast. Gradually return to your regular diet.  Drink enough fluid to keep your urine pale yellow.  If you vomit, rehydrate by drinking water, juice, or clear broth. General instructions  If you have sleep apnea, surgery and certain medicines can increase your risk for breathing problems. Follow instructions from your health care provider about wearing your sleep device: ? Anytime you are sleeping, including during daytime naps. ? While  taking prescription pain medicines, sleeping medicines, or medicines that make you drowsy.  Return to your normal activities as told by your health care provider. Ask your health care provider what activities are safe for you.  Take over-the-counter and prescription medicines only as told by your health care provider.  If you smoke, do not smoke without supervision.  Keep all follow-up visits as told by your health care provider. This is important. Contact a health care provider if:  You have nausea or vomiting that does not get better with medicine.  You cannot eat or drink without vomiting.  You have pain that does not get better with medicine.  You are unable to pass urine.  You develop a skin rash.  You have a fever.  You have redness around your IV site that gets worse. Get help right away if:  You have difficulty breathing.  You have chest pain.  You have blood in your urine or stool, or you vomit  blood. Summary  After the procedure, it is common to have a sore throat or nausea. It is also common to feel tired.  Have a responsible adult stay with you for the first 24 hours after general anesthesia. It is important to have someone help care for you until you are awake and alert.  When you feel hungry, start by eating small amounts of foods that are soft and easy to digest (bland), such as toast. Gradually return to your regular diet.  Drink enough fluid to keep your urine pale yellow.  Return to your normal activities as told by your health care provider. Ask your health care provider what activities are safe for you. This information is not intended to replace advice given to you by your health care provider. Make sure you discuss any questions you have with your health care provider. Document Released: 07/15/2000 Document Revised: 11/22/2016 Document Reviewed: 11/22/2016 Elsevier Interactive Patient Education  2019 [place].     Bupivacaine Liposomal Suspension for Injection What is this medicine? BUPIVACAINE LIPOSOMAL (bue PIV a kane LIP oh som al) is an anesthetic. It causes loss of feeling in the skin or other tissues. It is used to prevent and to treat pain from some procedures. This medicine may be used for other purposes; ask your health care provider or pharmacist if you have questions. COMMON BRAND NAME(S): EXPAREL What should I tell my health care provider before I take this medicine? They need to know if you have any of these conditions: -heart disease -kidney disease -liver disease -an unusual or allergic reaction to bupivacaine, other medicines, foods, dyes, or preservatives -pregnant or trying to get pregnant -breast-feeding How should I use this medicine? This medicine is for injection into the affected area. It is given by a health care professional in a hospital or clinic setting. Talk to your pediatrician regarding the use of this medicine in children.  Special care may be needed. Overdosage: If you think you have taken too much of this medicine contact a poison control center or emergency room at once. NOTE: This medicine is only for you. Do not share this medicine with others. What if I miss a dose? This does not apply. What may interact with this medicine? This medicine may interact with the following medications: -acetaminophen -certain antibiotics like dapsone, nitrofurantoin, aminosalicylic acid, sulfasalazine -certain medicines for seizures like phenobarbital, phenytoin, valproic acid -chloroquine -cyclophosphamide -flutamide -hydroxyurea -ifosfamide -metoclopramide -nitroglycerin -other local anesthetics like lidocaine, pramoxine, tetracaine -primaquine -quinine This list may not  describe all possible interactions. Give your health care provider a list of all the medicines, herbs, non-prescription drugs, or dietary supplements you use. Also tell them if you smoke, drink alcohol, or use illegal drugs. Some items may interact with your medicine. What should I watch for while using this medicine? Your condition will be monitored carefully while you are receiving this medicine. Be careful to avoid injury while the area is numb and you are not aware of pain. What side effects may I notice from receiving this medicine? Side effects that you should report to your doctor or health care professional as soon as possible: -allergic reactions like skin rash, itching or hives, swelling of the face, lips, or tongue -breathing problems -changes in vision -dizziness -fast or slow, irregular heartbeat -joint pain, stiffness, or loss of motion -seizures Side effects that usually do not require medical attention (report to your doctor or health care professional if they continue or are bothersome): -constipation -irritation at site where injected -nausea, vomiting -tiredness This list may not describe all possible side effects. Call your  doctor for medical advice about side effects. You may report side effects to FDA at 1-800-FDA-1088. Where should I keep my medicine? This drug is given in a hospital or clinic and will not be stored at home. NOTE: This sheet is a summary. It may not cover all possible information. If you have questions about this medicine, talk to your doctor, pharmacist, or health care provider.  2019 Elsevier/Gold Standard (2017-03-31 10:34:09)

## 2018-05-01 NOTE — Interval H&P Note (Signed)
History and Physical Interval Note:  05/01/2018 7:24 AM  [name]  has presented today for surgery, with the diagnosis of right inguinal hernia  The various methods of treatment have been discussed with the patient and family. After consideration of risks, benefits and other options for treatment, the patient has consented to  Procedure(s): RIGHT INGUINAL HERNIA REPAIR WITH MESH (Right) as a surgical intervention .  The patient's history has been reviewed, patient examined, no change in status, stable for surgery.  I have reviewed the patient's chart and labs.  Questions were answered to the patient's satisfaction.     No changes.   Virl Cagey

## 2018-05-04 ENCOUNTER — Encounter (HOSPITAL_COMMUNITY): Payer: Self-pay | Admitting: General Surgery

## 2018-05-08 ENCOUNTER — Telehealth: Payer: Self-pay | Admitting: Emergency Medicine

## 2018-05-08 ENCOUNTER — Telehealth: Payer: Self-pay | Admitting: General Surgery

## 2018-05-08 MED ORDER — GABAPENTIN 100 MG PO CAPS: 100.0000 mg | ORAL_CAPSULE | Freq: Three times a day (TID) | ORAL | 1 refills | Status: DC

## 2018-05-08 NOTE — Telephone Encounter (Signed)
Patient came in the office today stated he is having pain in his legs and the surgical site area stated it feels like " pins and needles are sticking me." He also stated he thought he had a fever this morning but did not check his tempeture. He denies vomiting or any other issues. I looked at the surgical site and there is no swelling or redness, notified the provider and she sent in [facility] for him to one tablet by mouth three times daily and she stated for him to call in on Monday and let us know how he is feeling because he may need to increase the medication to two tablets three times a day. The rx was sent in to [facility], notified the patient of this information and he stated he understood and that her would call Monday morning.

## 2018-05-08 NOTE — Telephone Encounter (Signed)
Patient came to the office with some burning /pins and needle pain. Will send him some gabapentin.   Gabapentin 100mg  TID may have to titrate up if not improving.   [name], MD [facility] [address] [place], [place] [address] [phone] (office)

## 2018-05-12 ENCOUNTER — Telehealth: Payer: Self-pay | Admitting: Emergency Medicine

## 2018-05-12 NOTE — Telephone Encounter (Signed)
Patient called and stated he is still having pain and did not think the gabapentin is helping him. Notified him that it is ok  To increase the gabapentin to two tables three times a day and to let doctor bridges know how he is feeling on on Thursday @ 1:45pm. Patient stated he understood and that he would increase the medication  this afternoon. He also stated he would be here for his f/u appointment on thursday

## 2018-05-14 ENCOUNTER — Ambulatory Visit (INDEPENDENT_AMBULATORY_CARE_PROVIDER_SITE_OTHER): Payer: Self-pay | Admitting: General Surgery

## 2018-05-14 ENCOUNTER — Encounter: Payer: Self-pay | Admitting: General Surgery

## 2018-05-14 VITALS — BP 147/86 | HR 75 | Temp 98.5°F | Resp 18 | Wt 187.6 lb

## 2018-05-14 DIAGNOSIS — K4090 Unilateral inguinal hernia, without obstruction or gangrene, not specified as recurrent: Secondary | ICD-10-CM

## 2018-05-14 MED ORDER — TRAMADOL HCL 50 MG PO TABS: 50.0000 mg | ORAL_TABLET | Freq: Four times a day (QID) | ORAL | 0 refills | Status: DC | PRN

## 2018-05-14 NOTE — Patient Instructions (Signed)
No heavy lifting > 10 lbs, no excessive squatting, bending, pushing, pulling for the next 6-8 weeks.  Roll on your side when getting in and out of bed to prevent strain on your abdomen.  Take your gabapentin as instructed. Take tramadol as needed for severe pain.

## 2018-05-14 NOTE — Progress Notes (Signed)
[name] Note   HPI:  73 y.o. Male presents to clinic for post-op follow-up after a right inguinal hernia repair Patient reports some burning and pins/ needle pain that we prescribed gabapentin for last week.  He is otherwise improving. Swelling going down.   Review of Systems:  Burning pain Improved swelling  All other review of systems: otherwise negative   Vital Signs:  BP (!) 147/86 (BP Location: Left Arm, Patient Position: Sitting, Cuff Size: Normal)   Pulse 75   Temp 98.5 F (36.9 C) (Temporal)   Resp 18   Wt 187 lb 9.6 oz (85.1 kg)   BMI 27.70 kg/m    Physical Exam:  Physical Exam Cardiovascular:     Rate and Rhythm: Normal rate.  Pulmonary:     Effort: Pulmonary effort is normal.  Abdominal:     Comments: Right groin with incision, c/d/i with dermabond. Some mild swelling. Induration  Neurological:     Mental Status: He is alert.      Assessment:  73 y.o. yo Male s/p R inguinal hernia repair. Doing fair.   Plan:  No heavy lifting > 10 lbs, no excessive squatting, bending, pushing, pulling for the next 6-8 weeks.  Roll on your side when getting in and out of bed to prevent strain on your abdomen.  Take your gabapentin as instructed. Take tramadol as needed for severe pain.   All of the above recommendations were discussed with the patient , and all of patient's questions were answered to his expressed satisfaction.  Zachary Labrum, MD [facility] [address] [place], [place] [address] [phone] (office)

## 2018-05-18 DIAGNOSIS — R7301 Impaired fasting glucose: Secondary | ICD-10-CM | POA: Diagnosis not present

## 2018-05-18 DIAGNOSIS — E782 Mixed hyperlipidemia: Secondary | ICD-10-CM | POA: Diagnosis not present

## 2018-05-18 DIAGNOSIS — I259 Chronic ischemic heart disease, unspecified: Secondary | ICD-10-CM | POA: Diagnosis not present

## 2018-05-18 DIAGNOSIS — I10 Essential (primary) hypertension: Secondary | ICD-10-CM | POA: Diagnosis not present

## 2018-05-20 DIAGNOSIS — C4441 Basal cell carcinoma of skin of scalp and neck: Secondary | ICD-10-CM | POA: Diagnosis not present

## 2018-05-20 DIAGNOSIS — L570 Actinic keratosis: Secondary | ICD-10-CM | POA: Diagnosis not present

## 2018-05-20 DIAGNOSIS — X32XXXD Exposure to sunlight, subsequent encounter: Secondary | ICD-10-CM | POA: Diagnosis not present

## 2018-05-26 ENCOUNTER — Encounter: Payer: Self-pay | Admitting: General Surgery

## 2018-05-26 ENCOUNTER — Ambulatory Visit (INDEPENDENT_AMBULATORY_CARE_PROVIDER_SITE_OTHER): Payer: Self-pay | Admitting: General Surgery

## 2018-05-26 VITALS — BP 154/96 | HR 83 | Temp 98.9°F | Resp 18 | Wt 188.0 lb

## 2018-05-26 DIAGNOSIS — K4090 Unilateral inguinal hernia, without obstruction or gangrene, not specified as recurrent: Secondary | ICD-10-CM

## 2018-05-26 MED ORDER — TRAMADOL HCL 50 MG PO TABS: 50.0000 mg | ORAL_TABLET | Freq: Four times a day (QID) | ORAL | 0 refills | Status: DC | PRN

## 2018-05-26 MED ORDER — GABAPENTIN 100 MG PO CAPS: 100.0000 mg | ORAL_CAPSULE | Freq: Three times a day (TID) | ORAL | 0 refills | Status: DC

## 2018-05-26 NOTE — Patient Instructions (Addendum)
Your gabapentin that you are taking 200mg  three times a day now.  Day 1 Take 100mg  in the AM, 200 mg at Lunch, and 200 mg at Night. Day 2 Take 100 mg in the AM, 100 mg at Lunch, and 200 mg at Night. Day 3 Take 100 mg in the AM,  100 mg at Lunch, and 100 mg at Night. Day 4 Take 100 mg in the AM and 100 mg at Night. Day 5 Take 100 mg in the AM and 100 mg at Night. Day 6 Take 100 mg at Night. Day 7 Take 100 mg at Night. Day 8 Stop taking.   Let us know if you are having worsening burning pain while on this taper.   No heavy lifting > 10 lbs and no excessive  no pushing, pulling, squatting until 6-8 weeks post operative.

## 2018-05-26 NOTE — Progress Notes (Signed)
[name] Note   HPI:  73 y.o. Male presents to clinic for follow-up evaluation of after right inguinal hernia repair. He is doing better. Still with some swelling. Burning pain is improving.  Review of Systems:  Tolerating diet. Pain improving Regular Bms  All other review of systems: otherwise negative   Vital Signs:  BP (!) 154/96 (BP Location: Left Arm, Patient Position: Sitting, Cuff Size: Normal)   Pulse 83   Temp 98.9 F (37.2 C) (Temporal)   Resp 18   Wt 188 lb (85.3 kg)   BMI 27.76 kg/m    Physical Exam:  Physical Exam Vitals signs reviewed.  Neck:     Musculoskeletal: Normal range of motion.  Cardiovascular:     Rate and Rhythm: Normal rate.  Pulmonary:     Effort: Pulmonary effort is normal.  Abdominal:     Comments: Right groin swelling improving, no erythema, dermabond peeling, appropriately tender  Neurological:     Mental Status: He is alert.      Assessment:  73 y.o. yo Male healing from right inguinal hernia repair. Doing fair. Given that he is on gabapentin will start to wean him off. Asking for a refill to the tramadol for night time pain.   Plan:  Your gabapentin that you are taking 200mg  three times a day now.  Day 1 Take 100mg  in the AM, 200 mg at Lunch, and 200 mg at Night. Day 2 Take 100 mg in the AM, 100 mg at Lunch, and 200 mg at Night. Day 3 Take 100 mg in the AM,  100 mg at Lunch, and 100 mg at Night. Day 4 Take 100 mg in the AM and 100 mg at Night. Day 5 Take 100 mg in the AM and 100 mg at Night. Day 6 Take 100 mg at Night. Day 7 Take 100 mg at Night. Day 8 Stop taking.   Let us know if you are having worsening burning pain while on this taper.   No heavy lifting > 10 lbs and no excessive  no pushing, pulling, squatting until 6-8 weeks post operative. Will see in a few weeks to make sure improving.   Future Appointments  Date Time Provider Rupert  06/23/2018  9:15 AM Virl Cagey, MD [facility] None     All of the above recommendations were discussed with the patient, and all of patient's questions were answered to his expressed satisfaction.  [name], MD [facility] [address] [place], [place] [address] [phone] (office)

## 2018-06-23 ENCOUNTER — Encounter: Payer: Self-pay | Admitting: General Surgery

## 2018-06-23 ENCOUNTER — Ambulatory Visit (INDEPENDENT_AMBULATORY_CARE_PROVIDER_SITE_OTHER): Payer: Self-pay | Admitting: General Surgery

## 2018-06-23 VITALS — BP 130/90 | HR 78 | Temp 98.6°F | Resp 20 | Wt 188.4 lb

## 2018-06-23 DIAGNOSIS — K4090 Unilateral inguinal hernia, without obstruction or gangrene, not specified as recurrent: Secondary | ICD-10-CM

## 2018-06-23 NOTE — Progress Notes (Signed)
[name] Note   HPI:  73 y.o. Male presents to clinic for post-op follow-up evaluation. He has had some nerve pain after his right inguinal hernia repair with burning. He says this is better but he still has some and takes the gabapentin about 2 times a day. He has weaned down.   Review of Systems:  No fever or chills Burning pain on occasion / pulling  All other review of systems: otherwise negative   Vital Signs:  BP 130/90 (BP Location: Left Arm, Patient Position: Sitting, Cuff Size: Normal)   Pulse 78   Temp 98.6 F (37 C) (Temporal)   Resp 20   Wt 188 lb 6.4 oz (85.5 kg)   BMI 27.82 kg/m    Physical Exam:  Physical Exam Vitals signs reviewed.  Neck:     Musculoskeletal: Normal range of motion.  Cardiovascular:     Rate and Rhythm: Normal rate.  Pulmonary:     Effort: Pulmonary effort is normal.  Abdominal:     General: There is no distension.     Palpations: Abdomen is soft.     Tenderness: There is no abdominal tenderness.     Hernia: No hernia is present.     Comments: Right inguinal region, improving, slight induration, no bulge or recurrence, incision healed    Assessment:  73 y.o. yo Male s/p right inguinal hernia. Doing well. Burning pain improving.   Plan:  - Continue to wean off gabapentin, take the ibuprofen and see if this resolves the pain and then if not take gabapentin  - Follow up in 6 weeks, hope to be off gabapentin at that time    All of the above recommendations were discussed with the patient, and all of patient's questions were answered to his expressed satisfaction.  [name], MD [facility] [address] [place], [place] [address] [phone] (office)

## 2018-06-23 NOTE — Patient Instructions (Signed)
Continue to wean off the gabapentin and ibuprofen.

## 2018-07-23 DIAGNOSIS — R5383 Other fatigue: Secondary | ICD-10-CM | POA: Diagnosis not present

## 2018-07-23 DIAGNOSIS — R296 Repeated falls: Secondary | ICD-10-CM | POA: Diagnosis not present

## 2018-07-23 DIAGNOSIS — I259 Chronic ischemic heart disease, unspecified: Secondary | ICD-10-CM | POA: Diagnosis not present

## 2018-07-23 DIAGNOSIS — M06059 Rheumatoid arthritis without rheumatoid factor, unspecified hip: Secondary | ICD-10-CM | POA: Diagnosis not present

## 2018-07-23 DIAGNOSIS — F5101 Primary insomnia: Secondary | ICD-10-CM | POA: Diagnosis not present

## 2018-07-23 DIAGNOSIS — R531 Weakness: Secondary | ICD-10-CM | POA: Diagnosis not present

## 2018-07-23 DIAGNOSIS — R7301 Impaired fasting glucose: Secondary | ICD-10-CM | POA: Diagnosis not present

## 2018-07-23 DIAGNOSIS — E782 Mixed hyperlipidemia: Secondary | ICD-10-CM | POA: Diagnosis not present

## 2018-07-23 DIAGNOSIS — K5720 Diverticulitis of large intestine with perforation and abscess without bleeding: Secondary | ICD-10-CM | POA: Diagnosis not present

## 2018-07-23 DIAGNOSIS — R350 Frequency of micturition: Secondary | ICD-10-CM | POA: Diagnosis not present

## 2018-07-23 DIAGNOSIS — F0390 Unspecified dementia without behavioral disturbance: Secondary | ICD-10-CM | POA: Diagnosis not present

## 2018-07-23 DIAGNOSIS — Z720 Tobacco use: Secondary | ICD-10-CM | POA: Diagnosis not present

## 2018-07-23 DIAGNOSIS — I10 Essential (primary) hypertension: Secondary | ICD-10-CM | POA: Diagnosis not present

## 2018-07-27 DIAGNOSIS — E875 Hyperkalemia: Secondary | ICD-10-CM | POA: Diagnosis not present

## 2018-07-27 DIAGNOSIS — I10 Essential (primary) hypertension: Secondary | ICD-10-CM | POA: Diagnosis not present

## 2018-07-27 DIAGNOSIS — E785 Hyperlipidemia, unspecified: Secondary | ICD-10-CM | POA: Diagnosis not present

## 2018-07-27 DIAGNOSIS — S92025A Nondisplaced fracture of anterior process of left calcaneus, initial encounter for closed fracture: Secondary | ICD-10-CM | POA: Diagnosis not present

## 2018-07-27 DIAGNOSIS — F17210 Nicotine dependence, cigarettes, uncomplicated: Secondary | ICD-10-CM | POA: Diagnosis not present

## 2018-07-27 DIAGNOSIS — I2510 Atherosclerotic heart disease of native coronary artery without angina pectoris: Secondary | ICD-10-CM | POA: Diagnosis not present

## 2018-07-27 DIAGNOSIS — Z03818 Encounter for observation for suspected exposure to other biological agents ruled out: Secondary | ICD-10-CM | POA: Diagnosis not present

## 2018-07-27 DIAGNOSIS — R7301 Impaired fasting glucose: Secondary | ICD-10-CM | POA: Diagnosis not present

## 2018-07-27 DIAGNOSIS — I4510 Unspecified right bundle-branch block: Secondary | ICD-10-CM | POA: Diagnosis not present

## 2018-07-29 ENCOUNTER — Telehealth: Payer: Self-pay | Admitting: Emergency Medicine

## 2018-07-29 NOTE — Telephone Encounter (Signed)
Called patient to follow up with him since his last visit to see if he is having any issues since his last visit. I f not ancel his appointment. No response, left vm

## 2018-08-05 ENCOUNTER — Telehealth: Payer: Self-pay | Admitting: Cardiovascular Disease

## 2018-08-05 NOTE — Telephone Encounter (Signed)
New Message:    Amy from Dr Gayland Curry to let you know pt have been taking Metoprolol Succinate 5 mg daily. He shoud have been taking Metoprolol Tartrate 12.5 mg 2 times a day. P says he t refuse to take something twice a day. Dr Nevada Crane wanted  Dr Angelena Form to be aware of this. If Dr Angelena Form wants pt to take the Metoprolol Tartrate, please contact the patientt.

## 2018-08-06 ENCOUNTER — Ambulatory Visit: Payer: Self-pay | Admitting: General Surgery

## 2018-08-06 NOTE — Telephone Encounter (Signed)
We can change him to Toprol 25 mg once daily. Thanks, chris

## 2018-08-10 MED ORDER — METOPROLOL SUCCINATE ER 25 MG PO TB24: 25.0000 mg | ORAL_TABLET | Freq: Every day | ORAL | 3 refills | Status: DC

## 2018-08-10 NOTE — Telephone Encounter (Signed)
Spoke with Mr. Gastelum Verified his current dose of metoprolol at home.  He is currently taking metoprolol succ ER 25 mg once a day. He has no more refills.  Will send to his preferred pharmacy. Toprol xl 25 mg once a day per Dr. Angelena Form Pt appreciative for the call. Due next for ov in Nov, will call if needs anything sooner.

## 2018-08-16 ENCOUNTER — Encounter: Payer: Self-pay | Admitting: General Surgery

## 2018-08-24 ENCOUNTER — Encounter (HOSPITAL_COMMUNITY): Payer: Self-pay

## 2018-08-24 ENCOUNTER — Emergency Department (HOSPITAL_COMMUNITY): Payer: PPO

## 2018-08-24 ENCOUNTER — Other Ambulatory Visit: Payer: Self-pay

## 2018-08-24 ENCOUNTER — Emergency Department (HOSPITAL_COMMUNITY)
Admission: EM | Admit: 2018-08-24 | Discharge: 2018-08-24 | Disposition: A | Discharge status: Home / Self Care | Payer: PPO | Attending: Emergency Medicine | Admitting: Emergency Medicine

## 2018-08-24 DIAGNOSIS — I259 Chronic ischemic heart disease, unspecified: Secondary | ICD-10-CM | POA: Diagnosis not present

## 2018-08-24 DIAGNOSIS — Z79899 Other long term (current) drug therapy: Secondary | ICD-10-CM | POA: Insufficient documentation

## 2018-08-24 DIAGNOSIS — E119 Type 2 diabetes mellitus without complications: Secondary | ICD-10-CM | POA: Insufficient documentation

## 2018-08-24 DIAGNOSIS — F17210 Nicotine dependence, cigarettes, uncomplicated: Secondary | ICD-10-CM | POA: Diagnosis not present

## 2018-08-24 DIAGNOSIS — Z7982 Long term (current) use of aspirin: Secondary | ICD-10-CM | POA: Insufficient documentation

## 2018-08-24 DIAGNOSIS — I10 Essential (primary) hypertension: Secondary | ICD-10-CM | POA: Insufficient documentation

## 2018-08-24 DIAGNOSIS — M25552 Pain in left hip: Secondary | ICD-10-CM | POA: Diagnosis not present

## 2018-08-24 DIAGNOSIS — M79652 Pain in left thigh: Secondary | ICD-10-CM | POA: Insufficient documentation

## 2018-08-24 HISTORY — DX: Atherosclerotic heart disease of native coronary artery without angina pectoris: I25.10

## 2018-08-24 MED ORDER — HYDROCODONE-ACETAMINOPHEN 5-325 MG PO TABS: 1.0000 | ORAL_TABLET | Freq: Three times a day (TID) | ORAL | 0 refills | Status: DC | PRN

## 2018-08-24 MED ORDER — DEXAMETHASONE 4 MG PO TABS: 4.0000 mg | ORAL_TABLET | Freq: Two times a day (BID) | ORAL | 0 refills | Status: DC

## 2018-08-24 MED ORDER — OXYCODONE-ACETAMINOPHEN 5-325 MG PO TABS
1.0000 | ORAL_TABLET | Freq: Once | ORAL | Status: AC
  Administered 2018-08-24: 10:00:00 1 via ORAL
  Filled 2018-08-24: qty 1

## 2018-08-24 MED ORDER — DEXAMETHASONE 4 MG PO TABS
8.0000 mg | ORAL_TABLET | Freq: Once | ORAL | Status: AC
  Administered 2018-08-24: 10:00:00 8 mg via ORAL
  Filled 2018-08-24: qty 2

## 2018-08-24 MED ORDER — KETOROLAC TROMETHAMINE 30 MG/ML IJ SOLN
15.0000 mg | Freq: Once | INTRAMUSCULAR | Status: AC
  Administered 2018-08-24: 10:00:00 15 mg via INTRAMUSCULAR
  Filled 2018-08-24: qty 1

## 2018-08-24 NOTE — ED Notes (Signed)
Pt to xray

## 2018-08-24 NOTE — ED Triage Notes (Signed)
Pt reports left leg pain that starts back of left buttocks and then radiates around left thigh to knee. No known injury. Dr HAll ordered celebrex without relief. Pain since last wednesday

## 2018-08-24 NOTE — ED Provider Notes (Signed)
[facility] EMERGENCY DEPARTMENT Provider Note   CSN: [id number] Arrival date & time: 08/24/18  0900    History   Chief Complaint Chief Complaint  Patient presents with  . Leg Pain    HPI [name] is a 73 y.o. male.     HPI  11yM with LLE pain. Onset about a week ago. Last Tuesday was preparing his garden and using a tiller. The next morning he began having pain in L hip/thigh down to knee. Constant. Worse in the morning and then will improve somewhat once he gets up and moving. No numbness or tingling. No loss of strength. No back pain. Has been using ibuprofen and pcp prescribed celebrex w/o much improvement.   Past Medical History:  Diagnosis Date  . CAD, NATIVE VESSEL 12/29/2008   Qualifier: Diagnosis of  By: Angelena Form, MD, Harrell Gave    . CHEST PAIN-UNSPECIFIED 12/24/2008   Qualifier: Diagnosis of  By: Ronne Binning    . Coronary artery disease   . Diabetes mellitus without complication ([place])   . DYSLIPIDEMIA 12/24/2008   Qualifier: Diagnosis of  By: Ronne Binning    . Hyperlipidemia   . Hypertension   . MI (myocardial infarction) ([place])   . TOBACCO USER 12/24/2008   Qualifier: Diagnosis of  By: Ronne Binning      Patient Active Problem List   Diagnosis Date Noted  . Right inguinal hernia 03/10/2018  . CAD, NATIVE VESSEL 12/29/2008  . DYSLIPIDEMIA 12/24/2008  . TOBACCO USER 12/24/2008  . CHEST PAIN-UNSPECIFIED 12/24/2008    Past Surgical History:  Procedure Laterality Date  . CATARACT EXTRACTION Bilateral   . COLONOSCOPY N/A 01/12/2014   Procedure: COLONOSCOPY;  Surgeon: Rogene Houston, MD;  Location: [facility]ITE;  Service: Endoscopy;  Laterality: N/A;  1030  . CORONARY ANGIOPLASTY    . HEMORROIDECTOMY    . INGUINAL HERNIA REPAIR Right 05/01/2018   Procedure: RIGHT INGUINAL HERNIA REPAIR WITH MESH;  Surgeon: Virl Cagey, MD;  Location: [facility] ORS;  Service: General;  Laterality: Right;  . right middle finger     reattached from accident        Home Medications    Prior to Admission medications   Medication Sig Start Date End Date Taking? Authorizing Provider  acetaminophen (TYLENOL) 325 MG tablet Take 650 mg by mouth every 6 (six) hours as needed for moderate pain.    [provider]  aspirin EC 81 MG tablet Take 81 mg by mouth daily.    [provider]  atorvastatin (LIPITOR) 20 MG tablet Take 20 mg by mouth daily.  10/22/16   [provider]  dexamethasone (DECADRON) 4 MG tablet Take 1 tablet (4 mg total) by mouth 2 (two) times daily. 08/24/18   Virgel Manifold, MD  docusate sodium (COLACE) 100 MG capsule Take 1 capsule (100 mg total) by mouth 2 (two) times daily. 05/01/18   Virl Cagey, MD  gabapentin (NEURONTIN) 100 MG capsule Take 1 capsule (100 mg total) by mouth 3 (three) times daily. 05/26/18   Virl Cagey, MD  HYDROcodone-acetaminophen (NORCO/VICODIN) 5-325 MG tablet Take 1 tablet by mouth every 8 (eight) hours as needed. 08/24/18   Virgel Manifold, MD  lisinopril (PRINIVIL,ZESTRIL) 20 MG tablet Take 20 mg by mouth daily.    [provider]  meloxicam (MOBIC) 15 MG tablet Take 15 mg by mouth daily as needed (arthritis).  09/18/16   [provider]  Menthol, Topical Analgesic, (ICY HOT EX)  Apply 1 application topically daily as needed (pain).    [provider]  metoprolol succinate (TOPROL XL) 25 MG 24 hr tablet Take 1 tablet (25 mg total) by mouth daily. 08/10/18   Burnell Blanks, MD  Omega-3 Fatty Acids (FISH OIL) 500 MG CAPS Take 500 mg by mouth daily.    [provider]  traMADol (ULTRAM) 50 MG tablet Take 1 tablet (50 mg total) by mouth every 6 (six) hours as needed for severe pain. 05/26/18   Virl Cagey, MD  varenicline (CHANTIX STARTING MONTH PAK) 0.5 MG X 11 & 1 MG X 42 tablet Take as directed Patient not taking: Reported on 03/18/2018 11/20/16   Burnell Blanks, MD    Family History Family History  Problem Relation Age of  Onset  . Alzheimer's disease Mother   . Heart attack Father     Social History Social History   Tobacco Use  . Smoking status: Current Every Day Smoker    Packs/day: 0.50    Years: 40.00    Pack years: 20.00  . Smokeless tobacco: Never Used  Substance Use Topics  . Alcohol use: Yes    Alcohol/week: 1.0 standard drinks    Types: 1 Cans of beer per week  . Drug use: No     Allergies   Patient has no known allergies.   Review of Systems Review of Systems  All systems reviewed and negative, other than as noted in HPI.  Physical Exam Updated Vital Signs BP 131/82   Pulse 74   Temp 98 F (36.7 C)   Resp 18   Ht 5\' 9"  (1.753 m)   Wt 84.4 kg   SpO2 99%   BMI 27.47 kg/m   Physical Exam Vitals signs and nursing note reviewed.  Constitutional:      General: He is not in acute distress.    Appearance: He is well-developed.  HENT:     Head: Normocephalic and atraumatic.  Eyes:     General:        Right eye: No discharge.        Left eye: No discharge.     Conjunctiva/sclera: Conjunctivae normal.  Neck:     Musculoskeletal: Neck supple.  Cardiovascular:     Rate and Rhythm: Normal rate and regular rhythm.     Heart sounds: Normal heart sounds. No murmur. No friction rub. No gallop.   Pulmonary:     Effort: Pulmonary effort is normal. No respiratory distress.     Breath sounds: Normal breath sounds.  Abdominal:     General: There is no distension.     Palpations: Abdomen is soft.     Tenderness: There is no abdominal tenderness.  Musculoskeletal: Normal range of motion.        General: No swelling, tenderness, deformity or signs of injury.     Right lower leg: No edema.     Left lower leg: No edema.  Skin:    General: Skin is warm and dry.  Neurological:     Mental Status: He is alert.  Psychiatric:        Behavior: Behavior normal.        Thought Content: Thought content normal.      ED Treatments / Results  Labs (all labs ordered are listed, but  only abnormal results are displayed) Labs Reviewed - No data to display  EKG None  Radiology Dg Hip Unilat W Or Wo Pelvis 2-3 Views Left  Result Date:  08/24/2018 CLINICAL DATA:  Acute left hip pain without known injury. EXAM: DG HIP (WITH OR WITHOUT PELVIS) 2-3V LEFT COMPARISON:  None. FINDINGS: There is no evidence of hip fracture or dislocation. There is no evidence of arthropathy or other focal bone abnormality. IMPRESSION: Negative. Electronically Signed   By: Marijo Conception M.D.   On: 08/24/2018 10:27    Procedures Procedures (including critical care time)  Medications Ordered in ED Medications  dexamethasone (DECADRON) tablet 8 mg (8 mg Oral Given 08/24/18 1002)  ketorolac (TORADOL) 30 MG/ML injection 15 mg (15 mg Intramuscular Given 08/24/18 1003)  oxyCODONE-acetaminophen (PERCOCET/ROXICET) 5-325 MG per tablet 1 tablet (1 tablet Oral Given 08/24/18 1002)     Initial Impression / Assessment and Plan / ED Course  I have reviewed the triage vital signs and the nursing notes.  Pertinent labs & imaging results that were available during my care of the patient were reviewed by me and considered in my medical decision making (see chart for details).        72yM with L hip/thigh pain. Suspect strain. Began the day after preparing garden. Negative imaging. NVI. Exam is unremarkable. Plan symptomatic tx. Activity as tolerated. PCP FU otherwise.   Final Clinical Impressions(s) / ED Diagnoses   Final diagnoses:  Left thigh pain    ED Discharge Orders         Ordered    dexamethasone (DECADRON) 4 MG tablet  2 times daily     08/24/18 1043    HYDROcodone-acetaminophen (NORCO/VICODIN) 5-325 MG tablet  Every 8 hours PRN     08/24/18 1043           Virgel Manifold, MD 08/24/18 1110

## 2018-08-24 NOTE — ED Notes (Signed)
Pt back from x-ray.

## 2018-08-28 DIAGNOSIS — M25552 Pain in left hip: Secondary | ICD-10-CM | POA: Diagnosis not present

## 2018-08-28 DIAGNOSIS — S76812D Strain of other specified muscles, fascia and tendons at thigh level, left thigh, subsequent encounter: Secondary | ICD-10-CM | POA: Diagnosis not present

## 2018-08-28 DIAGNOSIS — M79652 Pain in left thigh: Secondary | ICD-10-CM | POA: Diagnosis not present

## 2018-09-10 DIAGNOSIS — Z0000 Encounter for general adult medical examination without abnormal findings: Secondary | ICD-10-CM | POA: Diagnosis not present

## 2018-09-24 DIAGNOSIS — M79652 Pain in left thigh: Secondary | ICD-10-CM | POA: Diagnosis not present

## 2018-09-25 ENCOUNTER — Other Ambulatory Visit (HOSPITAL_COMMUNITY): Payer: Self-pay | Admitting: Orthopedic Surgery

## 2018-09-25 ENCOUNTER — Other Ambulatory Visit: Payer: Self-pay | Admitting: Orthopedic Surgery

## 2018-09-25 DIAGNOSIS — M5416 Radiculopathy, lumbar region: Secondary | ICD-10-CM

## 2018-09-25 DIAGNOSIS — M5442 Lumbago with sciatica, left side: Secondary | ICD-10-CM | POA: Diagnosis not present

## 2018-10-06 ENCOUNTER — Other Ambulatory Visit (HOSPITAL_COMMUNITY): Payer: Self-pay | Admitting: Orthopedic Surgery

## 2018-10-06 ENCOUNTER — Ambulatory Visit (HOSPITAL_COMMUNITY)
Admission: RE | Admit: 2018-10-06 | Discharge: 2018-10-06 | Disposition: A | Discharge status: Home / Self Care | Payer: PPO | Source: Ambulatory Visit | Attending: Orthopedic Surgery | Admitting: Orthopedic Surgery

## 2018-10-06 ENCOUNTER — Other Ambulatory Visit: Payer: Self-pay

## 2018-10-06 DIAGNOSIS — M5416 Radiculopathy, lumbar region: Secondary | ICD-10-CM | POA: Diagnosis not present

## 2018-10-06 DIAGNOSIS — M5136 Other intervertebral disc degeneration, lumbar region: Secondary | ICD-10-CM | POA: Diagnosis not present

## 2018-10-06 DIAGNOSIS — M5126 Other intervertebral disc displacement, lumbar region: Secondary | ICD-10-CM | POA: Diagnosis not present

## 2018-10-06 DIAGNOSIS — Z0389 Encounter for observation for other suspected diseases and conditions ruled out: Secondary | ICD-10-CM | POA: Diagnosis not present

## 2018-10-08 ENCOUNTER — Other Ambulatory Visit: Payer: Self-pay | Admitting: Vascular Surgery

## 2018-10-08 ENCOUNTER — Ambulatory Visit (INDEPENDENT_AMBULATORY_CARE_PROVIDER_SITE_OTHER): Payer: PPO | Admitting: Vascular Surgery

## 2018-10-08 ENCOUNTER — Encounter: Payer: Self-pay | Admitting: Vascular Surgery

## 2018-10-08 ENCOUNTER — Other Ambulatory Visit: Payer: Self-pay

## 2018-10-08 VITALS — BP 125/81 | HR 86 | Temp 98.3°F | Resp 20 | Ht 69.0 in | Wt 189.0 lb

## 2018-10-08 DIAGNOSIS — I714 Abdominal aortic aneurysm, without rupture, unspecified: Secondary | ICD-10-CM

## 2018-10-08 NOTE — Progress Notes (Signed)
Referring Physician: Dr French Ana  Patient name: [name] MRN: [id number] DOB: March 19, 1946 Sex: male  REASON FOR CONSULT: Abdominal aortic aneurysm  HPI: [name] is a 73 y.o. male who on evaluation for chronic back pain was found to have abdominal aortic aneurysm by MRI scan.  He recently saw Dr. French Ana.  Patient has chronic back pain primarily on the left side which radiates down across the left anterior aspect of his left thigh.  He has no family history of abdominal aortic aneurysm.  He has no abdominal pain.  He does not describe claudication symptoms.  He is a current smoker.  He is trying to quit.  Than 3 minutes today spent regarding smoking cessation counseling.  Other medical problems include coronary artery disease, hyperlipidemia, diabetes hypertension.  These are all currently controlled and stable.  He is on aspirin and a statin.  Past Medical History:  Diagnosis Date  . CAD, NATIVE VESSEL 12/29/2008   Qualifier: Diagnosis of  By: Angelena Form, MD, Harrell Gave    . CHEST PAIN-UNSPECIFIED 12/24/2008   Qualifier: Diagnosis of  By: Ronne Binning    . Coronary artery disease   . Diabetes mellitus without complication ([place])   . DYSLIPIDEMIA 12/24/2008   Qualifier: Diagnosis of  By: Ronne Binning    . Hyperlipidemia   . Hypertension   . MI (myocardial infarction) ([place])   . TOBACCO USER 12/24/2008   Qualifier: Diagnosis of  By: Ronne Binning     Past Surgical History:  Procedure Laterality Date  . CATARACT EXTRACTION Bilateral   . COLONOSCOPY N/A 01/12/2014   Procedure: COLONOSCOPY;  Surgeon: Rogene Houston, MD;  Location: [facility];  Service: Endoscopy;  Laterality: N/A;  1030  . CORONARY ANGIOPLASTY    . HEMORROIDECTOMY    . INGUINAL HERNIA REPAIR Right 05/01/2018   Procedure: RIGHT INGUINAL HERNIA REPAIR WITH MESH;  Surgeon: Virl Cagey, MD;  Location: [facility] ORS;  Service: General;  Laterality: Right;  . right middle finger     reattached from accident    Family History  Problem Relation Age of Onset  . Alzheimer's disease Mother   . Heart attack Father     SOCIAL HISTORY: Social History   Socioeconomic History  . Marital status: Divorced    Spouse name: Not on file  . Number of children: Not on file  . Years of education: Not on file  . Highest education level: Not on file  Occupational History  . Not on file  Social Needs  . Financial resource strain: Not on file  . Food insecurity    Worry: Not on file    Inability: Not on file  . Transportation needs    Medical: Not on file    Non-medical: Not on file  Tobacco Use  . Smoking status: Current Every Day Smoker    Packs/day: 0.25    Years: 40.00    Pack years: 10.00  . Smokeless tobacco: Never Used  Substance and Sexual Activity  . Alcohol use: Yes    Alcohol/week: 1.0 standard drinks    Types: 1 Cans of beer per week  . Drug use: No  . Sexual activity: Not Currently  Lifestyle  . Physical activity    Days per week: Not on file    Minutes per session: Not on file  . Stress: Not on file  Relationships  . Social connections    Talks on phone: Not on file  Gets together: Not on file    Attends religious service: Not on file    Active member of club or organization: Not on file    Attends meetings of clubs or organizations: Not on file    Relationship status: Not on file  . Intimate partner violence    Fear of current or ex partner: Not on file    Emotionally abused: Not on file    Physically abused: Not on file    Forced sexual activity: Not on file  Other Topics Concern  . Not on file  Social History Narrative  . Not on file    No Known Allergies  Current Outpatient Medications  Medication Sig Dispense Refill  . acetaminophen (TYLENOL) 325 MG tablet Take 650 mg by mouth every 6 (six) hours as needed for moderate pain.    Marland Kitchen aspirin EC 81 MG tablet Take 81 mg by mouth daily.    Marland Kitchen atorvastatin (LIPITOR) 20 MG tablet Take 20 mg by mouth daily.     Marland Kitchen  dexamethasone (DECADRON) 4 MG tablet Take 1 tablet (4 mg total) by mouth 2 (two) times daily. 8 tablet 0  . docusate sodium (COLACE) 100 MG capsule Take 1 capsule (100 mg total) by mouth 2 (two) times daily. 60 capsule 1  . gabapentin (NEURONTIN) 100 MG capsule Take 1 capsule (100 mg total) by mouth 3 (three) times daily. 90 capsule 0  . HYDROcodone-acetaminophen (NORCO/VICODIN) 5-325 MG tablet Take 1 tablet by mouth every 8 (eight) hours as needed. 12 tablet 0  . lisinopril (PRINIVIL,ZESTRIL) 20 MG tablet Take 20 mg by mouth daily.    . Menthol, Topical Analgesic, (ICY HOT EX) Apply 1 application topically daily as needed (pain).    . metoprolol succinate (TOPROL XL) 25 MG 24 hr tablet Take 1 tablet (25 mg total) by mouth daily. 90 tablet 3  . Omega-3 Fatty Acids (FISH OIL) 500 MG CAPS Take 500 mg by mouth daily.    . traMADol (ULTRAM) 50 MG tablet Take 1 tablet (50 mg total) by mouth every 6 (six) hours as needed for severe pain. 15 tablet 0  . meloxicam (MOBIC) 15 MG tablet Take 15 mg by mouth daily as needed (arthritis).     . varenicline (CHANTIX STARTING MONTH PAK) 0.5 MG X 11 & 1 MG X 42 tablet Take as directed (Patient not taking: Reported on 03/18/2018) 53 tablet 0   No current facility-administered medications for this visit.     ROS:   General:  No weight loss, Fever, chills  HEENT: No recent headaches, no nasal bleeding, no visual changes, no sore throat  Neurologic: No dizziness, blackouts, seizures. No recent symptoms of stroke or mini- stroke. No recent episodes of slurred speech, or temporary blindness.  Cardiac: No recent episodes of chest pain/pressure, no shortness of breath at rest.  No shortness of breath with exertion.  Denies history of atrial fibrillation or irregular heartbeat  Vascular: No history of rest pain in feet.  No history of claudication.  No history of non-healing ulcer, No history of DVT   Pulmonary: No home oxygen, no productive cough, no hemoptysis,   No asthma or wheezing  Musculoskeletal:  [ ]  Arthritis, [X]  Low back pain,  [ ]  Joint pain  Hematologic:No history of hypercoagulable state.  No history of easy bleeding.  No history of anemia  Gastrointestinal: No hematochezia or melena,  No gastroesophageal reflux, no trouble swallowing  Urinary: [ ]  chronic Kidney disease, [ ]  on HD - [ ]   MWF or [ ]  TTHS, [ ]  Burning with urination, [ ]  Frequent urination, [ ]  Difficulty urinating;   Skin: No rashes  Psychological: No history of anxiety,  No history of depression   Physical Examination  Vitals:   10/08/18 1426  BP: 125/81  Pulse: 86  Resp: 20  Temp: 98.3 F (36.8 C)  SpO2: 94%  Weight: 189 lb (85.7 kg)  Height: 5\' 9"  (1.753 m)    Body mass index is 27.91 kg/m.  General:  Alert and oriented, no acute distress HEENT: Normal Neck: No JVD Pulmonary: Clear to auscultation bilaterally Cardiac: Regular Rate and Rhythm  Abdomen: Soft, non-tender, non-distended, no mass, vaguely palpable pulsatile mass in the epigastrium Skin: No rash Extremity Pulses:  2+ radial, brachial, femoral, dorsalis pedis pulses bilaterally Musculoskeletal: No deformity or edema  Neurologic: Upper and lower extremity motor 5/5 and symmetric  DATA:  MRI dated October 07, 2018 shows multilevel disc disease in the lumbar spine with lateral recess and foraminal stenosis at multiple levels  4 cm abdominal aortic aneurysm, appears infrarenal apparent normal caliber up to the SMA but upper portions of the abdominal aorta and lower portions of the pelvis were not visualized on the scan  ASSESSMENT: 4 cm infrarenal abdominal aortic aneurysm.  I discussed the patient today the risk of rupture for the size aneurysm is quite small.  We would consider repair if it reaches 5-1/2 cm in diameter.  I do believe he needs a CT scan of the abdomen and pelvis to further clarify the entire anatomy of the aortoiliac segment.  We will schedule this for him in the near future.   Do not believe this back pain is related to his aneurysm.   PLAN: We will arrange for a virtual visit with the patient after his CT scan of the abdomen and pelvis if the findings are consistent with what was seen on the MRI scan the most likely we will have a follow-up visit with him in 6 months time.   Ruta Hinds, MD [facility] Office: [phone] Pager: [phone]

## 2018-10-27 ENCOUNTER — Ambulatory Visit (HOSPITAL_COMMUNITY)
Admission: RE | Admit: 2018-10-27 | Discharge: 2018-10-27 | Disposition: A | Discharge status: Home / Self Care | Payer: PPO | Source: Ambulatory Visit | Attending: Vascular Surgery | Admitting: Vascular Surgery

## 2018-10-27 ENCOUNTER — Other Ambulatory Visit: Payer: Self-pay

## 2018-10-27 DIAGNOSIS — I714 Abdominal aortic aneurysm, without rupture, unspecified: Secondary | ICD-10-CM

## 2018-10-27 LAB — POCT I-STAT CREATININE: Creatinine, Ser: 1 mg/dL (ref 0.61–1.24)

## 2018-10-27 MED ORDER — IOHEXOL 350 MG/ML SOLN
100.0000 mL | Freq: Once | INTRAVENOUS | Status: AC | PRN
  Administered 2018-10-27: 100 mL via INTRAVENOUS

## 2018-10-29 ENCOUNTER — Encounter: Payer: Self-pay | Admitting: Vascular Surgery

## 2018-10-29 ENCOUNTER — Ambulatory Visit (INDEPENDENT_AMBULATORY_CARE_PROVIDER_SITE_OTHER): Payer: PPO | Admitting: Vascular Surgery

## 2018-10-29 ENCOUNTER — Other Ambulatory Visit: Payer: Self-pay

## 2018-10-29 VITALS — BP 130/90 | Wt 189.0 lb

## 2018-10-29 DIAGNOSIS — I714 Abdominal aortic aneurysm, without rupture, unspecified: Secondary | ICD-10-CM

## 2018-10-29 NOTE — Progress Notes (Signed)
Virtual Visit via Telephone Note  I connected with [name] on 10/29/2018 using the Doxy.me by telephone and verified that I was speaking with the correct person using two identifiers. Patient was located at home and accompanied by no one. I am located at our office on [facility].  After several unsuccessful attempts to connect with the patient with the Doxy me out but this was converted to a phone virtual visit.   The limitations of evaluation and management by telemedicine and the availability of in person appointments have been previously discussed with the patient and are documented in the patients chart. The patient expressed understanding and consented to proceed.  PCP: Celene Squibb, MD  History of Present Illness: [name] is a 73 y.o. male with known abdominal aortic aneurysm.  He returns for follow-up today after recent CT scan to follow-up after this was discovered on MRI.  His aneurysm was originally discovered on an MRI scan for back pain.  He continues to have chronic back pain.  He denies abdominal pain.  He states he is trying to quit smoking.   Past Medical History:  Diagnosis Date  . CAD, NATIVE VESSEL 12/29/2008   Qualifier: Diagnosis of  By: Angelena Form, MD, Harrell Gave    . CHEST PAIN-UNSPECIFIED 12/24/2008   Qualifier: Diagnosis of  By: Ronne Binning    . Coronary artery disease   . Diabetes mellitus without complication ([place])   . DYSLIPIDEMIA 12/24/2008   Qualifier: Diagnosis of  By: Ronne Binning    . Hyperlipidemia   . Hypertension   . MI (myocardial infarction) ([place])   . TOBACCO USER 12/24/2008   Qualifier: Diagnosis of  By: Ronne Binning      Past Surgical History:  Procedure Laterality Date  . CATARACT EXTRACTION Bilateral   . COLONOSCOPY N/A 01/12/2014   Procedure: COLONOSCOPY;  Surgeon: Rogene Houston, MD;  [facility];  Service: Endoscopy;  Laterality: N/A;  1030  . CORONARY ANGIOPLASTY    . HEMORROIDECTOMY    . INGUINAL  HERNIA REPAIR Right 05/01/2018   Procedure: RIGHT INGUINAL HERNIA REPAIR WITH MESH;  Surgeon: Virl Cagey, MD;  Location: [facility] ORS;  Service: General;  Laterality: Right;  . right middle finger     reattached from accident    Current Meds  Medication Sig  . acetaminophen (TYLENOL) 325 MG tablet Take 650 mg by mouth every 6 (six) hours as needed for moderate pain.  Marland Kitchen aspirin EC 81 MG tablet Take 81 mg by mouth daily.  Marland Kitchen atorvastatin (LIPITOR) 20 MG tablet Take 20 mg by mouth daily.   Marland Kitchen dexamethasone (DECADRON) 4 MG tablet Take 1 tablet (4 mg total) by mouth 2 (two) times daily.  Marland Kitchen docusate sodium (COLACE) 100 MG capsule Take 1 capsule (100 mg total) by mouth 2 (two) times daily.  Marland Kitchen gabapentin (NEURONTIN) 100 MG capsule Take 1 capsule (100 mg total) by mouth 3 (three) times daily.  Marland Kitchen HYDROcodone-acetaminophen (NORCO/VICODIN) 5-325 MG tablet Take 1 tablet by mouth every 8 (eight) hours as needed.  Marland Kitchen lisinopril (PRINIVIL,ZESTRIL) 20 MG tablet Take 20 mg by mouth daily.  . meloxicam (MOBIC) 15 MG tablet Take 15 mg by mouth daily as needed (arthritis).   . Menthol, Topical Analgesic, (ICY HOT EX) Apply 1 application topically daily as needed (pain).  . metoprolol succinate (TOPROL XL) 25 MG 24 hr tablet Take 1 tablet (25 mg total) by mouth daily.  . Omega-3 Fatty  Acids (FISH OIL) 500 MG CAPS Take 500 mg by mouth daily.  . traMADol (ULTRAM) 50 MG tablet Take 1 tablet (50 mg total) by mouth every 6 (six) hours as needed for severe pain.  . varenicline (CHANTIX STARTING MONTH PAK) 0.5 MG X 11 & 1 MG X 42 tablet Take as directed   Review of systems: He denies shortness of breath.  He denies chest pain.  I reviewed the patient's images of his recent CT scan which shows an infrarenal 4.1 cm abdominal aortic aneurysm with no iliac component or evidence of rupture   Assessment and Plan: 4.1 cm abdominal aortic aneurysm   Follow Up Instructions: Patient will have a follow-up ultrasound and see 1  of our APP's in 1 year.    I discussed the assessment and treatment plan with the patient. The patient was provided an opportunity to ask questions and all were answered. The patient agreed with the plan and demonstrated an understanding of the instructions.   The patient was advised to call back or seek an in-person evaluation if the symptoms worsen or if the condition fails to improve as anticipated.  I spent 10 minutes with the patient via telephone encounter.   Signed, Ruta Hinds [facility] Office: [phone]  10/29/2018, 2:19 PM

## 2018-12-02 DIAGNOSIS — L820 Inflamed seborrheic keratosis: Secondary | ICD-10-CM | POA: Diagnosis not present

## 2018-12-02 DIAGNOSIS — B078 Other viral warts: Secondary | ICD-10-CM | POA: Diagnosis not present

## 2018-12-02 DIAGNOSIS — Z85828 Personal history of other malignant neoplasm of skin: Secondary | ICD-10-CM | POA: Diagnosis not present

## 2018-12-02 DIAGNOSIS — Z08 Encounter for follow-up examination after completed treatment for malignant neoplasm: Secondary | ICD-10-CM | POA: Diagnosis not present

## 2018-12-02 DIAGNOSIS — L821 Other seborrheic keratosis: Secondary | ICD-10-CM | POA: Diagnosis not present

## 2018-12-02 DIAGNOSIS — L570 Actinic keratosis: Secondary | ICD-10-CM | POA: Diagnosis not present

## 2018-12-25 ENCOUNTER — Other Ambulatory Visit: Payer: Self-pay

## 2018-12-25 DIAGNOSIS — Z20822 Contact with and (suspected) exposure to covid-19: Secondary | ICD-10-CM

## 2018-12-25 DIAGNOSIS — R6889 Other general symptoms and signs: Secondary | ICD-10-CM | POA: Diagnosis not present

## 2018-12-26 LAB — NOVEL CORONAVIRUS, NAA: SARS-CoV-2, NAA: NOT DETECTED

## 2019-01-10 DIAGNOSIS — I10 Essential (primary) hypertension: Secondary | ICD-10-CM | POA: Diagnosis not present

## 2019-01-10 DIAGNOSIS — I259 Chronic ischemic heart disease, unspecified: Secondary | ICD-10-CM | POA: Diagnosis not present

## 2019-01-10 DIAGNOSIS — E782 Mixed hyperlipidemia: Secondary | ICD-10-CM | POA: Diagnosis not present

## 2019-01-10 DIAGNOSIS — R7301 Impaired fasting glucose: Secondary | ICD-10-CM | POA: Diagnosis not present

## 2019-01-26 DIAGNOSIS — I259 Chronic ischemic heart disease, unspecified: Secondary | ICD-10-CM | POA: Diagnosis not present

## 2019-01-26 DIAGNOSIS — I10 Essential (primary) hypertension: Secondary | ICD-10-CM | POA: Diagnosis not present

## 2019-01-26 DIAGNOSIS — E782 Mixed hyperlipidemia: Secondary | ICD-10-CM | POA: Diagnosis not present

## 2019-01-26 DIAGNOSIS — R7301 Impaired fasting glucose: Secondary | ICD-10-CM | POA: Diagnosis not present

## 2019-01-28 DIAGNOSIS — M79652 Pain in left thigh: Secondary | ICD-10-CM | POA: Diagnosis not present

## 2019-01-28 DIAGNOSIS — I714 Abdominal aortic aneurysm, without rupture: Secondary | ICD-10-CM | POA: Diagnosis not present

## 2019-01-28 DIAGNOSIS — M5416 Radiculopathy, lumbar region: Secondary | ICD-10-CM | POA: Diagnosis not present

## 2019-01-28 DIAGNOSIS — R42 Dizziness and giddiness: Secondary | ICD-10-CM | POA: Diagnosis not present

## 2019-03-11 ENCOUNTER — Emergency Department (HOSPITAL_COMMUNITY)
Admission: EM | Admit: 2019-03-11 | Discharge: 2019-03-11 | Disposition: A | Discharge status: Home / Self Care | Payer: PPO | Attending: Emergency Medicine | Admitting: Emergency Medicine

## 2019-03-11 ENCOUNTER — Encounter (HOSPITAL_COMMUNITY): Payer: Self-pay

## 2019-03-11 ENCOUNTER — Emergency Department (HOSPITAL_COMMUNITY): Payer: PPO

## 2019-03-11 ENCOUNTER — Other Ambulatory Visit: Payer: Self-pay

## 2019-03-11 DIAGNOSIS — S3992XA Unspecified injury of lower back, initial encounter: Secondary | ICD-10-CM | POA: Diagnosis not present

## 2019-03-11 DIAGNOSIS — E119 Type 2 diabetes mellitus without complications: Secondary | ICD-10-CM | POA: Diagnosis not present

## 2019-03-11 DIAGNOSIS — Z7982 Long term (current) use of aspirin: Secondary | ICD-10-CM | POA: Insufficient documentation

## 2019-03-11 DIAGNOSIS — M542 Cervicalgia: Secondary | ICD-10-CM | POA: Diagnosis not present

## 2019-03-11 DIAGNOSIS — M549 Dorsalgia, unspecified: Secondary | ICD-10-CM | POA: Diagnosis present

## 2019-03-11 DIAGNOSIS — M545 Low back pain, unspecified: Secondary | ICD-10-CM

## 2019-03-11 DIAGNOSIS — I10 Essential (primary) hypertension: Secondary | ICD-10-CM | POA: Insufficient documentation

## 2019-03-11 DIAGNOSIS — R52 Pain, unspecified: Secondary | ICD-10-CM | POA: Diagnosis not present

## 2019-03-11 DIAGNOSIS — S79911A Unspecified injury of right hip, initial encounter: Secondary | ICD-10-CM | POA: Diagnosis not present

## 2019-03-11 DIAGNOSIS — M25551 Pain in right hip: Secondary | ICD-10-CM | POA: Diagnosis not present

## 2019-03-11 DIAGNOSIS — Z79899 Other long term (current) drug therapy: Secondary | ICD-10-CM | POA: Diagnosis not present

## 2019-03-11 DIAGNOSIS — I2510 Atherosclerotic heart disease of native coronary artery without angina pectoris: Secondary | ICD-10-CM | POA: Diagnosis not present

## 2019-03-11 DIAGNOSIS — F17210 Nicotine dependence, cigarettes, uncomplicated: Secondary | ICD-10-CM | POA: Insufficient documentation

## 2019-03-11 MED ORDER — IBUPROFEN 400 MG PO TABS
400.0000 mg | ORAL_TABLET | Freq: Once | ORAL | Status: AC
  Administered 2019-03-11: 400 mg via ORAL
  Filled 2019-03-11: qty 1

## 2019-03-11 NOTE — Discharge Instructions (Addendum)
Please follow up with your primary care provider within 5-7 days for re-evaluation of your symptoms. If you do not have a primary care provider, information for a healthcare clinic has been provided for you to make arrangements for follow up care. Please return to the emergency department for any new or worsening symptoms. ° °
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
## 2019-03-11 NOTE — ED Notes (Signed)
Patient ambulated in hall.  Patient able to walk on his own without assistance.

## 2019-03-17 DIAGNOSIS — M542 Cervicalgia: Secondary | ICD-10-CM | POA: Diagnosis not present

## 2019-03-17 DIAGNOSIS — M545 Low back pain: Secondary | ICD-10-CM | POA: Diagnosis not present

## 2019-03-30 DIAGNOSIS — E7849 Other hyperlipidemia: Secondary | ICD-10-CM | POA: Diagnosis not present

## 2019-03-30 DIAGNOSIS — I714 Abdominal aortic aneurysm, without rupture: Secondary | ICD-10-CM | POA: Diagnosis not present

## 2019-03-30 DIAGNOSIS — I10 Essential (primary) hypertension: Secondary | ICD-10-CM | POA: Diagnosis not present

## 2019-03-30 DIAGNOSIS — M79652 Pain in left thigh: Secondary | ICD-10-CM | POA: Diagnosis not present

## 2019-03-30 DIAGNOSIS — R42 Dizziness and giddiness: Secondary | ICD-10-CM | POA: Diagnosis not present

## 2019-03-30 DIAGNOSIS — M5416 Radiculopathy, lumbar region: Secondary | ICD-10-CM | POA: Diagnosis not present

## 2019-03-30 DIAGNOSIS — I2510 Atherosclerotic heart disease of native coronary artery without angina pectoris: Secondary | ICD-10-CM | POA: Diagnosis not present

## 2019-03-30 DIAGNOSIS — M06059 Rheumatoid arthritis without rheumatoid factor, unspecified hip: Secondary | ICD-10-CM | POA: Diagnosis not present

## 2019-03-31 DIAGNOSIS — M542 Cervicalgia: Secondary | ICD-10-CM | POA: Diagnosis not present

## 2019-03-31 DIAGNOSIS — M545 Low back pain: Secondary | ICD-10-CM | POA: Diagnosis not present

## 2019-04-13 DIAGNOSIS — R7301 Impaired fasting glucose: Secondary | ICD-10-CM | POA: Diagnosis not present

## 2019-04-13 DIAGNOSIS — E782 Mixed hyperlipidemia: Secondary | ICD-10-CM | POA: Diagnosis not present

## 2019-04-13 DIAGNOSIS — I10 Essential (primary) hypertension: Secondary | ICD-10-CM | POA: Diagnosis not present

## 2019-04-13 DIAGNOSIS — E785 Hyperlipidemia, unspecified: Secondary | ICD-10-CM | POA: Diagnosis not present

## 2019-04-20 DIAGNOSIS — M25552 Pain in left hip: Secondary | ICD-10-CM | POA: Diagnosis not present

## 2019-04-20 DIAGNOSIS — R7301 Impaired fasting glucose: Secondary | ICD-10-CM | POA: Diagnosis not present

## 2019-04-20 DIAGNOSIS — M25551 Pain in right hip: Secondary | ICD-10-CM | POA: Diagnosis not present

## 2019-04-20 DIAGNOSIS — E785 Hyperlipidemia, unspecified: Secondary | ICD-10-CM | POA: Diagnosis not present

## 2019-04-20 DIAGNOSIS — I2510 Atherosclerotic heart disease of native coronary artery without angina pectoris: Secondary | ICD-10-CM | POA: Diagnosis not present

## 2019-04-20 DIAGNOSIS — I10 Essential (primary) hypertension: Secondary | ICD-10-CM | POA: Diagnosis not present

## 2019-04-20 DIAGNOSIS — F17210 Nicotine dependence, cigarettes, uncomplicated: Secondary | ICD-10-CM | POA: Diagnosis not present

## 2019-04-20 DIAGNOSIS — I714 Abdominal aortic aneurysm, without rupture: Secondary | ICD-10-CM | POA: Diagnosis not present

## 2019-05-21 DIAGNOSIS — R7301 Impaired fasting glucose: Secondary | ICD-10-CM | POA: Diagnosis not present

## 2019-05-21 DIAGNOSIS — I10 Essential (primary) hypertension: Secondary | ICD-10-CM | POA: Diagnosis not present

## 2019-05-21 DIAGNOSIS — F17210 Nicotine dependence, cigarettes, uncomplicated: Secondary | ICD-10-CM | POA: Diagnosis not present

## 2019-05-21 DIAGNOSIS — M25552 Pain in left hip: Secondary | ICD-10-CM | POA: Diagnosis not present

## 2019-05-21 DIAGNOSIS — M25551 Pain in right hip: Secondary | ICD-10-CM | POA: Diagnosis not present

## 2019-05-21 DIAGNOSIS — I714 Abdominal aortic aneurysm, without rupture: Secondary | ICD-10-CM | POA: Diagnosis not present

## 2019-05-21 DIAGNOSIS — E785 Hyperlipidemia, unspecified: Secondary | ICD-10-CM | POA: Diagnosis not present

## 2019-05-21 DIAGNOSIS — I2510 Atherosclerotic heart disease of native coronary artery without angina pectoris: Secondary | ICD-10-CM | POA: Diagnosis not present

## 2019-08-03 DIAGNOSIS — E782 Mixed hyperlipidemia: Secondary | ICD-10-CM | POA: Diagnosis not present

## 2019-08-03 DIAGNOSIS — I10 Essential (primary) hypertension: Secondary | ICD-10-CM | POA: Diagnosis not present

## 2019-08-03 DIAGNOSIS — R7301 Impaired fasting glucose: Secondary | ICD-10-CM | POA: Diagnosis not present

## 2019-08-03 DIAGNOSIS — I259 Chronic ischemic heart disease, unspecified: Secondary | ICD-10-CM | POA: Diagnosis not present

## 2019-09-08 ENCOUNTER — Encounter: Payer: Self-pay | Admitting: Cardiovascular Disease

## 2019-09-08 ENCOUNTER — Ambulatory Visit: Payer: PPO | Admitting: Cardiovascular Disease

## 2019-09-08 ENCOUNTER — Other Ambulatory Visit: Payer: Self-pay

## 2019-09-08 VITALS — BP 124/72 | HR 81 | Ht 69.0 in | Wt 187.0 lb

## 2019-09-08 DIAGNOSIS — E7800 Pure hypercholesterolemia, unspecified: Secondary | ICD-10-CM

## 2019-09-08 DIAGNOSIS — I10 Essential (primary) hypertension: Secondary | ICD-10-CM

## 2019-09-08 DIAGNOSIS — I2510 Atherosclerotic heart disease of native coronary artery without angina pectoris: Secondary | ICD-10-CM | POA: Diagnosis not present

## 2019-09-08 NOTE — Progress Notes (Signed)
Chief Complaint  Patient presents with  . Follow-up    CAD   History of Present Illness: 74 yo male with history of CAD, DM, HLD, HTN and tobacco abuse who is here for follow up. He had a NSTEMI in August of 2010 and was found to have a severe stenosis in the mid Circumflex artery treated with a large bare metal stent. He had moderate residual disease in the mid RCA at that time. LV function was normal in 2011. Nuclear stress test in July 2018 with no ischemia.   He is here today for follow up. The patient denies any chest pain, dyspnea, palpitations, lower extremity edema, orthopnea, PND, dizziness, near syncope or syncope.    Primary Care Physician: Celene Squibb, MD  Past Medical History:  Diagnosis Date  . CAD, NATIVE VESSEL 12/29/2008   Qualifier: Diagnosis of  By: Angelena Form, MD, Harrell Gave    . CHEST PAIN-UNSPECIFIED 12/24/2008   Qualifier: Diagnosis of  By: Ronne Binning    . Coronary artery disease   . Diabetes mellitus without complication ([place])   . DYSLIPIDEMIA 12/24/2008   Qualifier: Diagnosis of  By: Ronne Binning    . Hyperlipidemia   . Hypertension   . MI (myocardial infarction) ([place])   . TOBACCO USER 12/24/2008   Qualifier: Diagnosis of  By: Ronne Binning      Past Surgical History:  Procedure Laterality Date  . CATARACT EXTRACTION Bilateral   . COLONOSCOPY N/A 01/12/2014   Procedure: COLONOSCOPY;  Surgeon: Rogene Houston, MD;  Location: [facility];  Service: Endoscopy;  Laterality: N/A;  1030  . CORONARY ANGIOPLASTY    . HEMORROIDECTOMY    . INGUINAL HERNIA REPAIR Right 05/01/2018   Procedure: RIGHT INGUINAL HERNIA REPAIR WITH MESH;  Surgeon: Virl Cagey, MD;  Location: [facility];  Service: General;  Laterality: Right;  . right middle finger     reattached from accident    Current Outpatient Medications  Medication Sig Dispense Refill  . acetaminophen (TYLENOL) 325 MG tablet Take 650 mg by mouth every 6 (six) hours as needed for moderate pain.     Marland Kitchen aspirin EC 81 MG tablet Take 81 mg by mouth daily.    Marland Kitchen atorvastatin (LIPITOR) 20 MG tablet Take 20 mg by mouth daily.     Marland Kitchen dexamethasone (DECADRON) 4 MG tablet Take 1 tablet (4 mg total) by mouth 2 (two) times daily. 8 tablet 0  . docusate sodium (COLACE) 100 MG capsule Take 1 capsule (100 mg total) by mouth 2 (two) times daily. 60 capsule 1  . gabapentin (NEURONTIN) 100 MG capsule Take 1 capsule (100 mg total) by mouth 3 (three) times daily. 90 capsule 0  . HYDROcodone-acetaminophen (NORCO/VICODIN) 5-325 MG tablet Take 1 tablet by mouth every 8 (eight) hours as needed. 12 tablet 0  . ibuprofen (ADVIL) 800 MG tablet Take 800 mg by mouth 3 (three) times daily as needed.    Marland Kitchen lisinopril (PRINIVIL,ZESTRIL) 20 MG tablet Take 20 mg by mouth daily.    . meloxicam (MOBIC) 15 MG tablet Take 15 mg by mouth daily as needed (arthritis).     . Menthol, Topical Analgesic, (ICY HOT EX) Apply 1 application topically daily as needed (pain).    . metoprolol succinate (TOPROL XL) 25 MG 24 hr tablet Take 1 tablet (25 mg total) by mouth daily. 90 tablet 3  . Omega-3 Fatty Acids (FISH OIL) 500 MG CAPS Take 500 mg by mouth daily.    Marland Kitchen  traMADol (ULTRAM) 50 MG tablet Take 1 tablet (50 mg total) by mouth every 6 (six) hours as needed for severe pain. 15 tablet 0   No current facility-administered medications for this visit.    No Known Allergies  Social History   Socioeconomic History  . Marital status: Divorced    Spouse name: Not on file  . Number of children: Not on file  . Years of education: Not on file  . Highest education level: Not on file  Occupational History  . Not on file  Tobacco Use  . Smoking status: Current Every Day Smoker    Packs/day: 0.25    Years: 40.00    Pack years: 10.00  . Smokeless tobacco: Never Used  Substance and Sexual Activity  . Alcohol use: Yes    Alcohol/week: 1.0 standard drinks    Types: 1 Cans of beer per week  . Drug use: No  . Sexual activity: Not Currently   Other Topics Concern  . Not on file  Social History Narrative  . Not on file   Social Determinants of Health   Financial Resource Strain:   . Difficulty of Paying Living Expenses:   Food Insecurity:   . Worried About Charity fundraiser in the Last Year:   . Arboriculturist in the Last Year:   Transportation Needs:   . Film/video editor (Medical):   Marland Kitchen Lack of Transportation (Non-Medical):   Physical Activity:   . Days of Exercise per Week:   . Minutes of Exercise per Session:   Stress:   . Feeling of Stress :   Social Connections:   . Frequency of Communication with Friends and Family:   . Frequency of Social Gatherings with Friends and Family:   . Attends Religious Services:   . Active Member of Clubs or Organizations:   . Attends Archivist Meetings:   Marland Kitchen Marital Status:   Intimate Partner Violence:   . Fear of Current or Ex-Partner:   . Emotionally Abused:   Marland Kitchen Physically Abused:   . Sexually Abused:     Family History  Problem Relation Age of Onset  . Alzheimer's disease Mother   . Heart attack Father     Review of Systems:  As stated in the HPI and otherwise negative.   BP 124/72   Pulse 81   Ht 5\' 9"  (1.753 m)   Wt 187 lb (84.8 kg)   SpO2 98%   BMI 27.62 kg/m   Physical Examination: General: Well developed, well nourished, NAD  HEENT: OP clear, mucus membranes moist  SKIN: warm, dry. No rashes. Neuro: No focal deficits  Musculoskeletal: Muscle strength 5/5 all ext  Psychiatric: Mood and affect normal  Neck: No JVD, no carotid bruits, no thyromegaly, no lymphadenopathy.  Lungs:Clear bilaterally, no wheezes, rhonci, crackles Cardiovascular: Regular rate and rhythm. No murmurs, gallops or rubs. Abdomen:Soft. Bowel sounds present. Non-tender.  Extremities: No lower extremity edema. Pulses are 2 + in the bilateral DP/PT.  EKG:  EKG is not ordered today. The ekg ordered today demonstrates   Recent Labs: 10/27/2018: Creatinine, Ser 1.00     Lipid Panel    Component Value Date/Time   CHOL 106 05/17/2009 0122   TRIG 111 05/17/2009 0122   HDL 26 (L) 05/17/2009 0122   CHOLHDL 4.1 Ratio 05/17/2009 0122   VLDL 22 05/17/2009 0122   LDLCALC 58 05/17/2009 0122     Wt Readings from Last 3 Encounters:  09/08/19 187 lb (  84.8 kg)  10/29/18 189 lb (85.7 kg)  10/08/18 189 lb (85.7 kg)     Other studies Reviewed: Additional studies/ records that were reviewed today include:  Review of the above records demonstrates:   Assessment and Plan:   1. CAD without angina: He is known to have CAD with prior stenting of the Circumflex artery in 2010 with moderate disease in the RCA at that time. Nuclear stress test in July 2018 without ischemia. No chest pain. Continue ASA, beta blocker and statin.    2. HTN: BP is controlled. No changes.   3. HLD: Lipids followed in primary care. Continue statin.    4. Tobacco abuse: He is asked to stop smoking.   Current medicines are reviewed at length with the patient today.  The patient does not have concerns regarding medicines.  The following changes have been made:  no change  Labs/ tests ordered today include:   No orders of the defined types were placed in this encounter.  Disposition:   FU with me in 12 months  Signed, Lauree Chandler, MD 09/08/2019 4:34 PM    [facility] [address], [place], [place]  [address] Phone: [phone]; Fax: [phone]

## 2019-09-08 NOTE — Patient Instructions (Signed)

## 2019-09-30 ENCOUNTER — Other Ambulatory Visit: Payer: Self-pay | Admitting: Cardiovascular Disease

## 2019-10-21 DIAGNOSIS — E875 Hyperkalemia: Secondary | ICD-10-CM | POA: Diagnosis not present

## 2019-10-21 DIAGNOSIS — Z5181 Encounter for therapeutic drug level monitoring: Secondary | ICD-10-CM | POA: Diagnosis not present

## 2019-10-21 DIAGNOSIS — I2510 Atherosclerotic heart disease of native coronary artery without angina pectoris: Secondary | ICD-10-CM | POA: Diagnosis not present

## 2019-10-21 DIAGNOSIS — E785 Hyperlipidemia, unspecified: Secondary | ICD-10-CM | POA: Diagnosis not present

## 2019-10-21 DIAGNOSIS — M25559 Pain in unspecified hip: Secondary | ICD-10-CM | POA: Diagnosis not present

## 2019-10-21 DIAGNOSIS — F17210 Nicotine dependence, cigarettes, uncomplicated: Secondary | ICD-10-CM | POA: Diagnosis not present

## 2019-10-21 DIAGNOSIS — I10 Essential (primary) hypertension: Secondary | ICD-10-CM | POA: Diagnosis not present

## 2019-10-21 DIAGNOSIS — E7849 Other hyperlipidemia: Secondary | ICD-10-CM | POA: Diagnosis not present

## 2019-10-21 DIAGNOSIS — I259 Chronic ischemic heart disease, unspecified: Secondary | ICD-10-CM | POA: Diagnosis not present

## 2019-10-21 DIAGNOSIS — S76812D Strain of other specified muscles, fascia and tendons at thigh level, left thigh, subsequent encounter: Secondary | ICD-10-CM | POA: Diagnosis not present

## 2019-10-21 DIAGNOSIS — E782 Mixed hyperlipidemia: Secondary | ICD-10-CM | POA: Diagnosis not present

## 2019-10-21 DIAGNOSIS — R42 Dizziness and giddiness: Secondary | ICD-10-CM | POA: Diagnosis not present

## 2019-10-26 DIAGNOSIS — M545 Low back pain: Secondary | ICD-10-CM | POA: Diagnosis not present

## 2019-10-26 DIAGNOSIS — L989 Disorder of the skin and subcutaneous tissue, unspecified: Secondary | ICD-10-CM | POA: Diagnosis not present

## 2019-10-26 DIAGNOSIS — R7301 Impaired fasting glucose: Secondary | ICD-10-CM | POA: Diagnosis not present

## 2019-10-26 DIAGNOSIS — M25551 Pain in right hip: Secondary | ICD-10-CM | POA: Diagnosis not present

## 2019-10-26 DIAGNOSIS — R05 Cough: Secondary | ICD-10-CM | POA: Diagnosis not present

## 2019-10-26 DIAGNOSIS — I10 Essential (primary) hypertension: Secondary | ICD-10-CM | POA: Diagnosis not present

## 2019-10-26 DIAGNOSIS — E785 Hyperlipidemia, unspecified: Secondary | ICD-10-CM | POA: Diagnosis not present

## 2019-10-26 DIAGNOSIS — I2510 Atherosclerotic heart disease of native coronary artery without angina pectoris: Secondary | ICD-10-CM | POA: Diagnosis not present

## 2019-10-26 DIAGNOSIS — Z0001 Encounter for general adult medical examination with abnormal findings: Secondary | ICD-10-CM | POA: Diagnosis not present

## 2019-10-26 DIAGNOSIS — I714 Abdominal aortic aneurysm, without rupture: Secondary | ICD-10-CM | POA: Diagnosis not present

## 2019-10-26 DIAGNOSIS — M25552 Pain in left hip: Secondary | ICD-10-CM | POA: Diagnosis not present

## 2019-10-26 DIAGNOSIS — F17210 Nicotine dependence, cigarettes, uncomplicated: Secondary | ICD-10-CM | POA: Diagnosis not present

## 2019-11-15 DIAGNOSIS — F17210 Nicotine dependence, cigarettes, uncomplicated: Secondary | ICD-10-CM | POA: Diagnosis not present

## 2019-11-15 DIAGNOSIS — I10 Essential (primary) hypertension: Secondary | ICD-10-CM | POA: Diagnosis not present

## 2019-11-15 DIAGNOSIS — R7301 Impaired fasting glucose: Secondary | ICD-10-CM | POA: Diagnosis not present

## 2019-11-15 DIAGNOSIS — R05 Cough: Secondary | ICD-10-CM | POA: Diagnosis not present

## 2019-11-15 DIAGNOSIS — M25551 Pain in right hip: Secondary | ICD-10-CM | POA: Diagnosis not present

## 2019-11-15 DIAGNOSIS — E785 Hyperlipidemia, unspecified: Secondary | ICD-10-CM | POA: Diagnosis not present

## 2019-11-15 DIAGNOSIS — L989 Disorder of the skin and subcutaneous tissue, unspecified: Secondary | ICD-10-CM | POA: Diagnosis not present

## 2019-11-15 DIAGNOSIS — M25552 Pain in left hip: Secondary | ICD-10-CM | POA: Diagnosis not present

## 2019-11-15 DIAGNOSIS — M545 Low back pain: Secondary | ICD-10-CM | POA: Diagnosis not present

## 2019-11-15 DIAGNOSIS — I2510 Atherosclerotic heart disease of native coronary artery without angina pectoris: Secondary | ICD-10-CM | POA: Diagnosis not present

## 2019-11-15 DIAGNOSIS — I714 Abdominal aortic aneurysm, without rupture: Secondary | ICD-10-CM | POA: Diagnosis not present

## 2019-11-16 ENCOUNTER — Other Ambulatory Visit: Payer: Self-pay

## 2019-11-16 DIAGNOSIS — I714 Abdominal aortic aneurysm, without rupture, unspecified: Secondary | ICD-10-CM

## 2019-11-18 ENCOUNTER — Ambulatory Visit: Payer: PPO | Admitting: Physician Assistant

## 2019-11-18 ENCOUNTER — Other Ambulatory Visit: Payer: Self-pay

## 2019-11-18 ENCOUNTER — Ambulatory Visit (HOSPITAL_COMMUNITY)
Admission: RE | Admit: 2019-11-18 | Discharge: 2019-11-18 | Disposition: A | Discharge status: Home / Self Care | Payer: PPO | Source: Ambulatory Visit | Attending: Vascular Surgery | Admitting: Vascular Surgery

## 2019-11-18 VITALS — BP 133/91 | HR 68 | Temp 97.8°F | Resp 20 | Ht 69.0 in | Wt 185.4 lb

## 2019-11-18 DIAGNOSIS — F17200 Nicotine dependence, unspecified, uncomplicated: Secondary | ICD-10-CM | POA: Diagnosis not present

## 2019-11-18 DIAGNOSIS — I714 Abdominal aortic aneurysm, without rupture, unspecified: Secondary | ICD-10-CM

## 2019-11-18 NOTE — Progress Notes (Signed)
HISTORY AND PHYSICAL     CC:  follow up. Requesting Provider:  Celene Squibb, MD  HPI: This is a 74 y.o. male who is here today for follow up for AAA.  He has a known AAA that was found on MRI that was done for back pain followed by CT scan and returns today for follow up.    Pt was last seen 10/29/2018 by Dr. Oneida Alar via telephone visit and at that time, he did not have any abdominal pain.  He continued to have chronic back pain.  He was trying to quit smoking. His AAA measured 4.1cm   The pt returns today for follow up ultrasound.  He states he still has his chronic back pain, but denies any sharp, sudden abdominal or back pain.  He denies any claudication sx or non healing wounds.  He states he does have some dizziness when he stands up quickly.  He also states the past couple of days in the heat have zapped his energy.    He plays classic rock on bass guitar and his girlfriend who is a Marine scientist at [employer].    The pt is on a statin for cholesterol management.    The pt is on an aspirin.    Other AC:  none The pt is on ACEI & BB for hypertension.  The pt does not have diabetes. Tobacco hx:  Current.  He states he quit for a year when his father passed away, but started back a year later.   Pt does not have family hx of AAA.  Past Medical History:  Diagnosis Date  . CAD, NATIVE VESSEL 12/29/2008   Qualifier: Diagnosis of  By: Angelena Form, MD, Harrell Gave    . CHEST PAIN-UNSPECIFIED 12/24/2008   Qualifier: Diagnosis of  By: Ronne Binning    . Coronary artery disease   . Diabetes mellitus without complication ([place])   . DYSLIPIDEMIA 12/24/2008   Qualifier: Diagnosis of  By: Ronne Binning    . Hyperlipidemia   . Hypertension   . MI (myocardial infarction) ([place])   . TOBACCO USER 12/24/2008   Qualifier: Diagnosis of  By: Ronne Binning      Past Surgical History:  Procedure Laterality Date  . CATARACT EXTRACTION Bilateral   . COLONOSCOPY N/A 01/12/2014    Procedure: COLONOSCOPY;  Surgeon: Rogene Houston, MD;  [facility];  Service: Endoscopy;  Laterality: N/A;  1030  . CORONARY ANGIOPLASTY    . HEMORROIDECTOMY    . INGUINAL HERNIA REPAIR Right 05/01/2018   Procedure: RIGHT INGUINAL HERNIA REPAIR WITH MESH;  Surgeon: Virl Cagey, MD;  Location: [facility];  Service: General;  Laterality: Right;  . right middle finger     reattached from accident    No Known Allergies  Current Outpatient Medications  Medication Sig Dispense Refill  . acetaminophen (TYLENOL) 325 MG tablet Take 650 mg by mouth every 6 (six) hours as needed for moderate pain.    Marland Kitchen aspirin EC 81 MG tablet Take 81 mg by mouth daily.    Marland Kitchen atorvastatin (LIPITOR) 20 MG tablet Take 20 mg by mouth daily.     Marland Kitchen dexamethasone (DECADRON) 4 MG tablet Take 1 tablet (4 mg total) by mouth 2 (two) times daily. 8 tablet 0  . docusate sodium (COLACE) 100 MG capsule Take 1 capsule (100 mg total) by mouth 2 (two) times daily. 60 capsule 1  . gabapentin (NEURONTIN) 100 MG capsule Take 1  capsule (100 mg total) by mouth 3 (three) times daily. 90 capsule 0  . HYDROcodone-acetaminophen (NORCO/VICODIN) 5-325 MG tablet Take 1 tablet by mouth every 8 (eight) hours as needed. 12 tablet 0  . ibuprofen (ADVIL) 800 MG tablet Take 800 mg by mouth 3 (three) times daily as needed.    Marland Kitchen lisinopril (PRINIVIL,ZESTRIL) 20 MG tablet Take 20 mg by mouth daily.    . meloxicam (MOBIC) 15 MG tablet Take 15 mg by mouth daily as needed (arthritis).     . Menthol, Topical Analgesic, (ICY HOT EX) Apply 1 application topically daily as needed (pain).    . metoprolol succinate (TOPROL-XL) 25 MG 24 hr tablet TAKE 1 TABLET BY MOUTH ONCE DAILY. 90 tablet 3  . Omega-3 Fatty Acids (FISH OIL) 500 MG CAPS Take 500 mg by mouth daily.    . traMADol (ULTRAM) 50 MG tablet Take 1 tablet (50 mg total) by mouth every 6 (six) hours as needed for severe pain. 15 tablet 0   No current facility-administered medications for this  visit.    Family History  Problem Relation Age of Onset  . Alzheimer's disease Mother   . Heart attack Father     Social History   Socioeconomic History  . Marital status: Divorced    Spouse name: Not on file  . Number of children: Not on file  . Years of education: Not on file  . Highest education level: Not on file  Occupational History  . Not on file  Tobacco Use  . Smoking status: Current Every Day Smoker    Packs/day: 0.25    Years: 40.00    Pack years: 10.00  . Smokeless tobacco: Never Used  Vaping Use  . Vaping Use: Never used  Substance and Sexual Activity  . Alcohol use: Yes    Alcohol/week: 1.0 standard drink    Types: 1 Cans of beer per week  . Drug use: No  . Sexual activity: Not Currently  Other Topics Concern  . Not on file  Social History Narrative  . Not on file   Social Determinants of Health   Financial Resource Strain:   . Difficulty of Paying Living Expenses:   Food Insecurity:   . Worried About Charity fundraiser in the Last Year:   . Arboriculturist in the Last Year:   Transportation Needs:   . Film/video editor (Medical):   Marland Kitchen Lack of Transportation (Non-Medical):   Physical Activity:   . Days of Exercise per Week:   . Minutes of Exercise per Session:   Stress:   . Feeling of Stress :   Social Connections:   . Frequency of Communication with Friends and Family:   . Frequency of Social Gatherings with Friends and Family:   . Attends Religious Services:   . Active Member of Clubs or Organizations:   . Attends Archivist Meetings:   Marland Kitchen Marital Status:   Intimate Partner Violence:   . Fear of Current or Ex-Partner:   . Emotionally Abused:   Marland Kitchen Physically Abused:   . Sexually Abused:      REVIEW OF SYSTEMS:   [X]  denotes positive finding, [ ]  denotes negative finding Cardiac  Comments:  Chest pain or chest pressure:    Shortness of breath upon exertion:    Short of breath when lying flat:    Irregular heart  rhythm:        Vascular    Pain in calf, thigh, or  hip brought on by ambulation:    Pain in feet at night that wakes you up from your sleep:     Blood clot in your veins:    Leg swelling:         Pulmonary    Oxygen at home:    Productive cough:     Wheezing:         Neurologic    Sudden weakness in arms or legs:     Sudden numbness in arms or legs:     Sudden onset of difficulty speaking or slurred speech:    Temporary loss of vision in one eye:     Problems with dizziness:  x See HPI      Gastrointestinal    Blood in stool:     Vomited blood:         Genitourinary    Burning when urinating:     Blood in urine:        Psychiatric    Major depression:         Hematologic    Bleeding problems:    Problems with blood clotting too easily:        Skin    Rashes or ulcers:        Constitutional    Fever or chills:      PHYSICAL EXAMINATION:  Today's Vitals   11/18/19 0822  BP: (!) 133/91  Pulse: 68  Resp: 20  Temp: 97.8 F (36.6 C)  TempSrc: Temporal  SpO2: 98%  Weight: 185 lb 6.4 oz (84.1 kg)  Height: 5\' 9"  (1.753 m)  PainSc: 2    Body mass index is 27.38 kg/m.   General:  WDWN in NAD; vital signs documented above Gait: Not observed HENT: WNL, normocephalic Pulmonary: normal non-labored breathing , without wheezing Cardiac: regular HR, without  Murmur; without carotid bruits Abdomen: soft, NT, aortic pulse is palpable Skin: without rashes Vascular Exam/Pulses:  Right Left  Radial 2+ (normal) 1+ (weak)  Ulnar Unable to palpate  Unable to palpate   Femoral 1+ (weak); did not press firmly due to pubic bone fx per pt 2+ (normal)  Popliteal Unable to palpate Unable to palpate   DP Biphasic and palpable biphasic  PT biphasic biphasic  Peroneal biphasic monophasic   Extremities: without ischemic changes, without Gangrene , without cellulitis; without open wounds;  Musculoskeletal: no muscle wasting or atrophy  Neurologic: A&O X 3;  No focal  weakness or paresthesias are detected Psychiatric:  The pt has Normal affect.   Non-Invasive Vascular Imaging:    AAA Arterial duplex on 11/18/2019: Abdominal Aorta Findings:  +-----------+-------+----------+----------+--------+--------+--------+  Location  AP (cm)Trans (cm)PSV (cm/s)WaveformThrombusComments  +-----------+-------+----------+----------+--------+--------+--------+  Proximal  2.70  2.66   47                  +-----------+-------+----------+----------+--------+--------+--------+  Mid    4.06  4.16   18                  +-----------+-------+----------+----------+--------+--------+--------+  Distal   2.66  2.76   25                  +-----------+-------+----------+----------+--------+--------+--------+  RT CIA Prox1.0  1.0    97                  +-----------+-------+----------+----------+--------+--------+--------+  LT CIA Prox1.7  1.7    194                  +-----------+-------+----------+----------+--------+--------+--------+  Visualization of the Proximal Abdominal Aorta and Left CIA Proximal artery  was limited.   Summary:  Abdominal Aorta: The largest aortic measurement is 4.2 cm. The largest  aortic diameter remains essentially unchanged compared to CT diameter  measuring 4.1 cm obtained on 10/27/2018.   Previous CTA 10/27/2018: IMPRESSION:  VASCULAR 1. Fusiform infrarenal abdominal aortic aneurysm with a maximal diameter of 4.1 cm.   NON-VASCULAR 1. No acute abnormality in the abdomen or pelvis. 2. Multilevel degenerative disc disease.  ASSESSMENT/PLAN:: 74 y.o. male here for follow up for AAA  AAA -pt doing well and remains asymptomatic from AAA.  It measures 4.2cm today.  Discussed with pt that he is at low risk for rupture and if he develops sudden sharp abdominal or back pain, he should get to  [facility] immediately.   -continue good blood pressure control -pt will f/u in one year with AAA duplex.  He will call sooner should he have any issues before then.  Smoking cessation -had greater than 3 minute conversation on importance of quitting smoking.  He has tried Chantix and could not tolerate it.  Discussed [id number].  He tells me he will think hard about our discussion and consider quitting.   -discussed with pt to try to stay indoors on days like today when the temperature is mid 90's given he felt bad being outside for the past couple of days in the heat.  Also discussed with him when he gets up, to get up slowly given he has some dizziness occasionally when he stands up from being on the ground working on his tractor.    Leontine Locket, [facility] [phone]  Clinic MD:   Oneida Alar

## 2019-11-19 ENCOUNTER — Other Ambulatory Visit: Payer: Self-pay

## 2019-11-19 DIAGNOSIS — I714 Abdominal aortic aneurysm, without rupture, unspecified: Secondary | ICD-10-CM

## 2019-12-06 DIAGNOSIS — B078 Other viral warts: Secondary | ICD-10-CM | POA: Diagnosis not present

## 2019-12-06 DIAGNOSIS — D225 Melanocytic nevi of trunk: Secondary | ICD-10-CM | POA: Diagnosis not present

## 2019-12-06 DIAGNOSIS — L570 Actinic keratosis: Secondary | ICD-10-CM | POA: Diagnosis not present

## 2019-12-06 DIAGNOSIS — Z85828 Personal history of other malignant neoplasm of skin: Secondary | ICD-10-CM | POA: Diagnosis not present

## 2019-12-06 DIAGNOSIS — Z08 Encounter for follow-up examination after completed treatment for malignant neoplasm: Secondary | ICD-10-CM | POA: Diagnosis not present

## 2019-12-06 DIAGNOSIS — X32XXXD Exposure to sunlight, subsequent encounter: Secondary | ICD-10-CM | POA: Diagnosis not present

## 2020-01-10 DIAGNOSIS — I2510 Atherosclerotic heart disease of native coronary artery without angina pectoris: Secondary | ICD-10-CM | POA: Diagnosis not present

## 2020-01-10 DIAGNOSIS — M25552 Pain in left hip: Secondary | ICD-10-CM | POA: Diagnosis not present

## 2020-01-10 DIAGNOSIS — R7301 Impaired fasting glucose: Secondary | ICD-10-CM | POA: Diagnosis not present

## 2020-01-10 DIAGNOSIS — F17210 Nicotine dependence, cigarettes, uncomplicated: Secondary | ICD-10-CM | POA: Diagnosis not present

## 2020-01-10 DIAGNOSIS — I10 Essential (primary) hypertension: Secondary | ICD-10-CM | POA: Diagnosis not present

## 2020-01-10 DIAGNOSIS — R05 Cough: Secondary | ICD-10-CM | POA: Diagnosis not present

## 2020-01-10 DIAGNOSIS — M545 Low back pain: Secondary | ICD-10-CM | POA: Diagnosis not present

## 2020-01-10 DIAGNOSIS — I714 Abdominal aortic aneurysm, without rupture: Secondary | ICD-10-CM | POA: Diagnosis not present

## 2020-01-10 DIAGNOSIS — E785 Hyperlipidemia, unspecified: Secondary | ICD-10-CM | POA: Diagnosis not present

## 2020-01-10 DIAGNOSIS — L989 Disorder of the skin and subcutaneous tissue, unspecified: Secondary | ICD-10-CM | POA: Diagnosis not present

## 2020-01-10 DIAGNOSIS — M25551 Pain in right hip: Secondary | ICD-10-CM | POA: Diagnosis not present

## 2020-01-31 DIAGNOSIS — I10 Essential (primary) hypertension: Secondary | ICD-10-CM | POA: Diagnosis not present

## 2020-01-31 DIAGNOSIS — M25551 Pain in right hip: Secondary | ICD-10-CM | POA: Diagnosis not present

## 2020-01-31 DIAGNOSIS — M25552 Pain in left hip: Secondary | ICD-10-CM | POA: Diagnosis not present

## 2020-01-31 DIAGNOSIS — I2510 Atherosclerotic heart disease of native coronary artery without angina pectoris: Secondary | ICD-10-CM | POA: Diagnosis not present

## 2020-01-31 DIAGNOSIS — E785 Hyperlipidemia, unspecified: Secondary | ICD-10-CM | POA: Diagnosis not present

## 2020-01-31 DIAGNOSIS — R7301 Impaired fasting glucose: Secondary | ICD-10-CM | POA: Diagnosis not present

## 2020-01-31 DIAGNOSIS — I714 Abdominal aortic aneurysm, without rupture: Secondary | ICD-10-CM | POA: Diagnosis not present

## 2020-01-31 DIAGNOSIS — L989 Disorder of the skin and subcutaneous tissue, unspecified: Secondary | ICD-10-CM | POA: Diagnosis not present

## 2020-01-31 DIAGNOSIS — F17210 Nicotine dependence, cigarettes, uncomplicated: Secondary | ICD-10-CM | POA: Diagnosis not present

## 2020-02-09 DIAGNOSIS — R0981 Nasal congestion: Secondary | ICD-10-CM | POA: Diagnosis not present

## 2020-02-09 DIAGNOSIS — R059 Cough, unspecified: Secondary | ICD-10-CM | POA: Diagnosis not present

## 2020-03-02 DIAGNOSIS — I259 Chronic ischemic heart disease, unspecified: Secondary | ICD-10-CM | POA: Diagnosis not present

## 2020-03-02 DIAGNOSIS — R7301 Impaired fasting glucose: Secondary | ICD-10-CM | POA: Diagnosis not present

## 2020-03-02 DIAGNOSIS — I10 Essential (primary) hypertension: Secondary | ICD-10-CM | POA: Diagnosis not present

## 2020-03-02 DIAGNOSIS — E782 Mixed hyperlipidemia: Secondary | ICD-10-CM | POA: Diagnosis not present

## 2020-03-13 DIAGNOSIS — E119 Type 2 diabetes mellitus without complications: Secondary | ICD-10-CM | POA: Diagnosis not present

## 2020-03-18 ENCOUNTER — Other Ambulatory Visit: Payer: Self-pay | Admitting: Nurse Practitioner

## 2020-03-25 IMAGING — MR MRI LUMBAR SPINE WITHOUT CONTRAST
4 of 5 series · 15 of 48 positions shown · non-contrast
Comparison: CT scan 08/01/2012

CLINICAL DATA: Low back and left knee pain for 6 weeks.

EXAM:
MRI LUMBAR SPINE WITHOUT CONTRAST
TECHNIQUE: Multiplanar, multisequence MR imaging of the lumbar spine was
performed. No intravenous contrast was administered.

[Series 3: T2 · sagittal · 4.0mm · 0.71mm/px · 6 of 17 slices shown (1 of 2)]
[im 1/17]
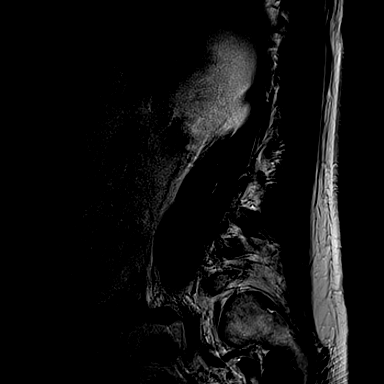
[im 4/17]
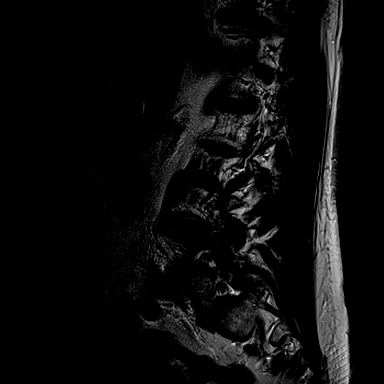
[im 7/17]
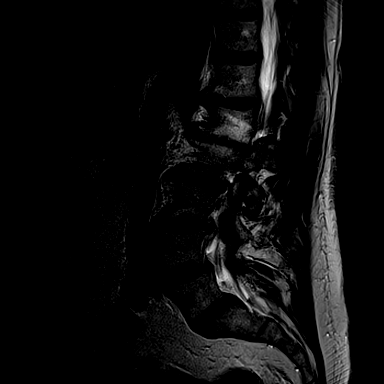
[im 10/17]
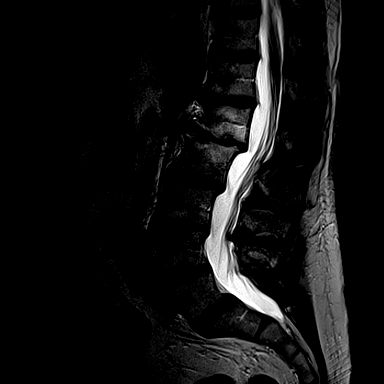
[im 13/17]
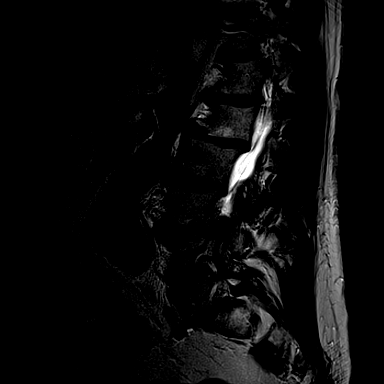
[im 17/17]
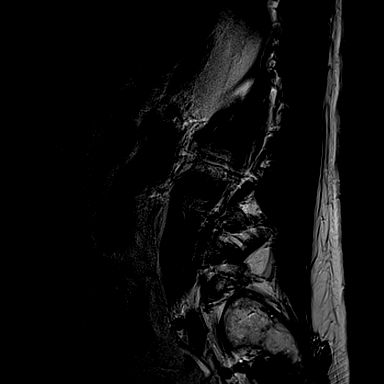

[Series 4: T1 · sagittal · 4.0mm · 0.36mm/px · 3 of 17 slices shown (1 of 2)]
[im 4/17]
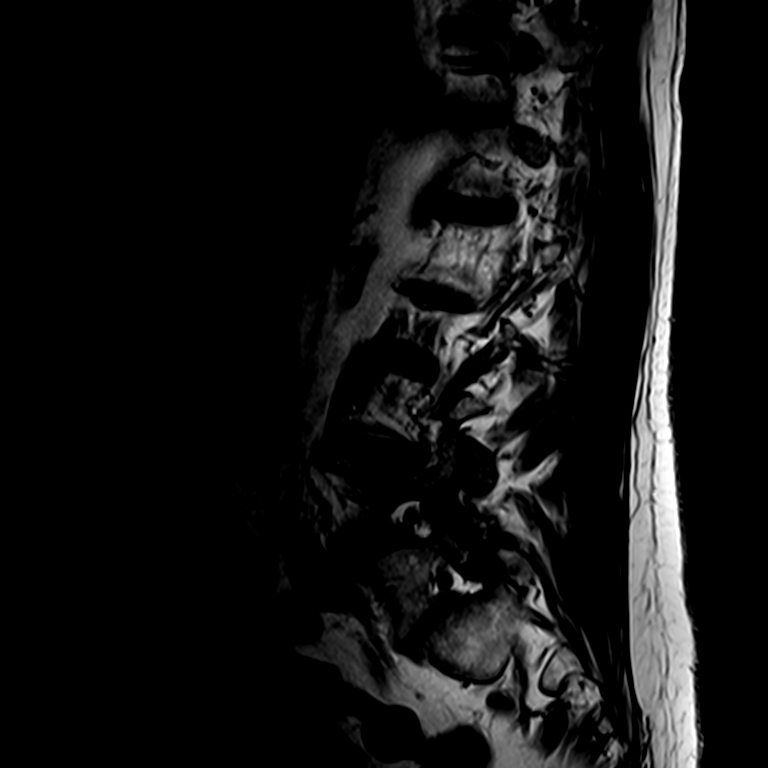
[im 10/17]
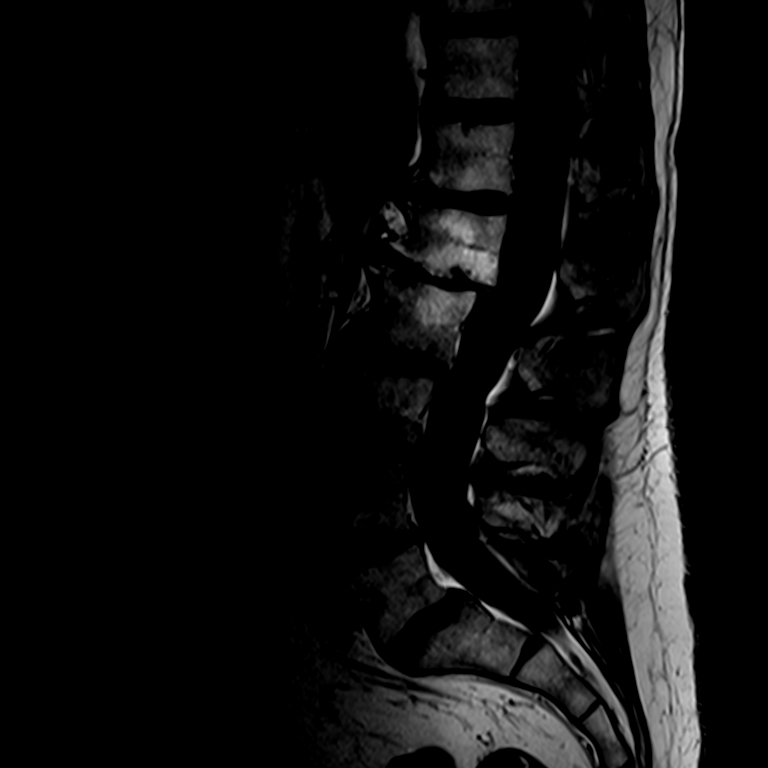
[im 17/17]
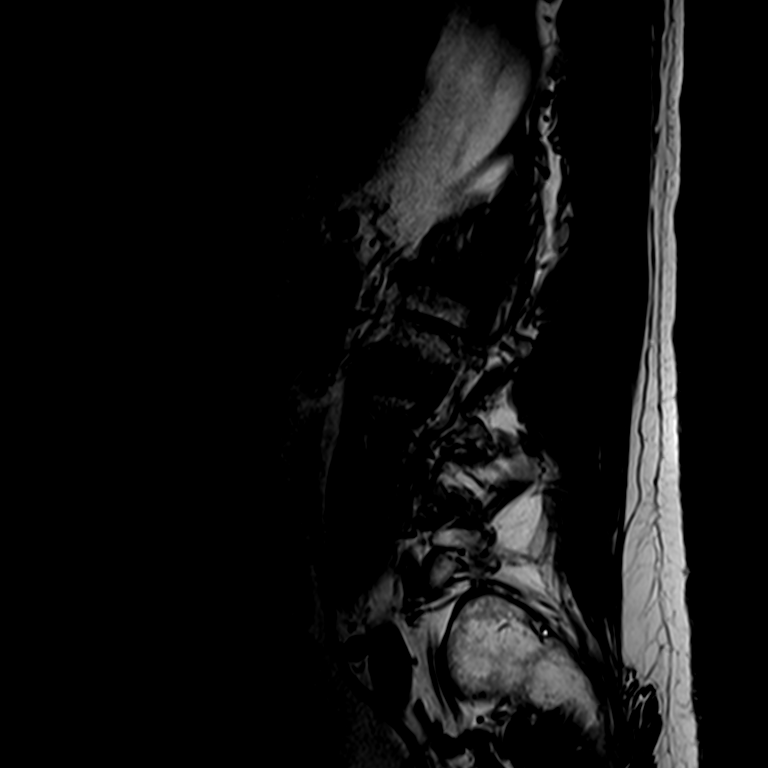

[Series 6: T2 · axial · 4.0mm · 0.24mm/px · z∈[-96,+58]mm · 3 of 42 slices shown (2 of 2)]
[im 6/42]
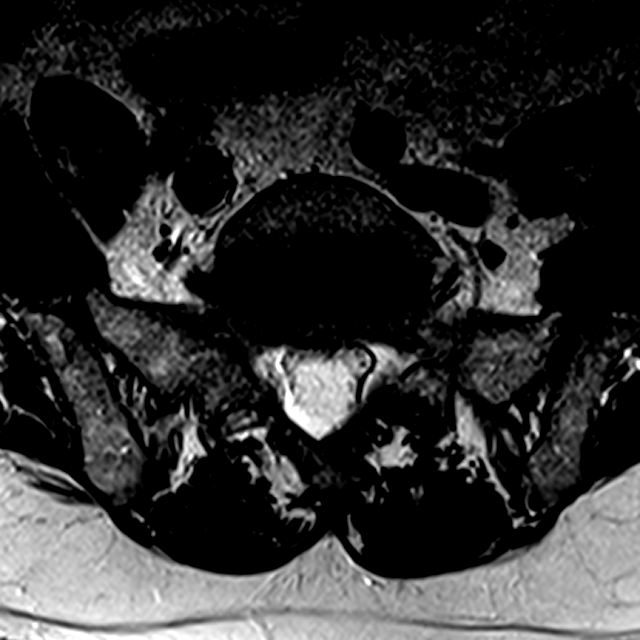
[im 21/42]
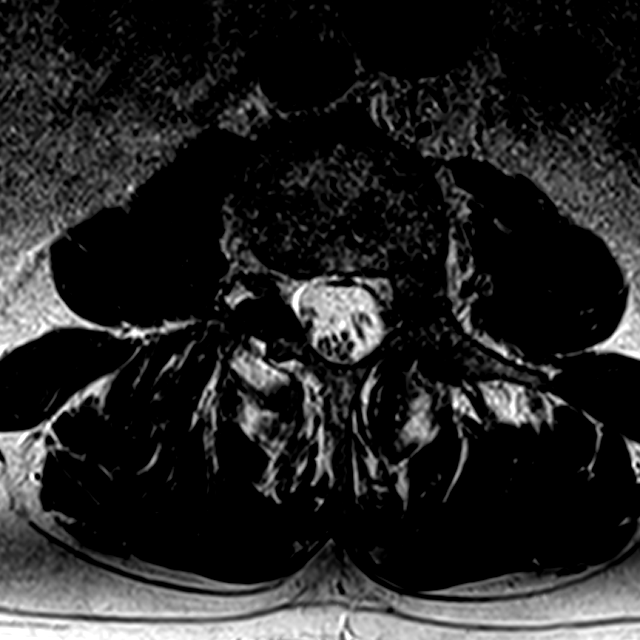
[im 36/42]
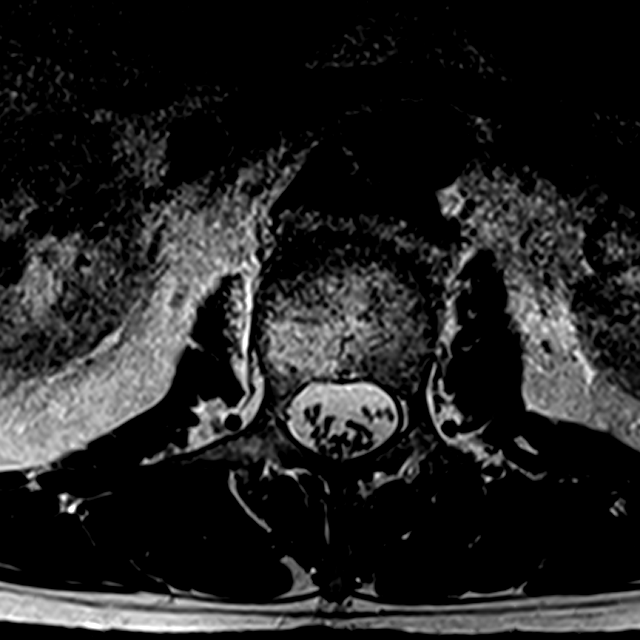

[Series 7: T1 · axial · 4.0mm · 0.24mm/px · z∈[-96,+58]mm · 3 of 42 slices shown (2 of 2)]
[im 6/42]
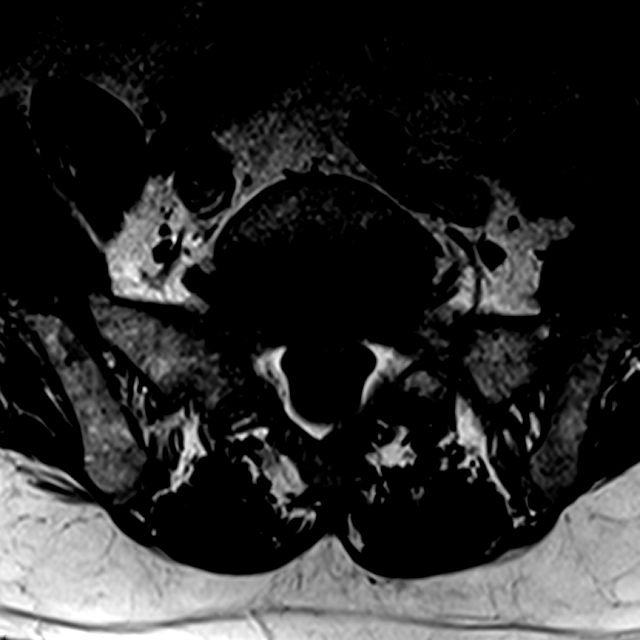
[im 21/42]
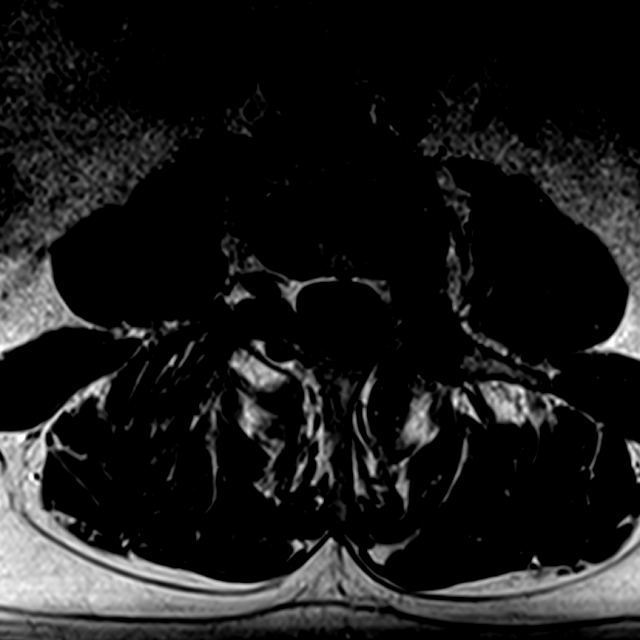
[im 36/42]
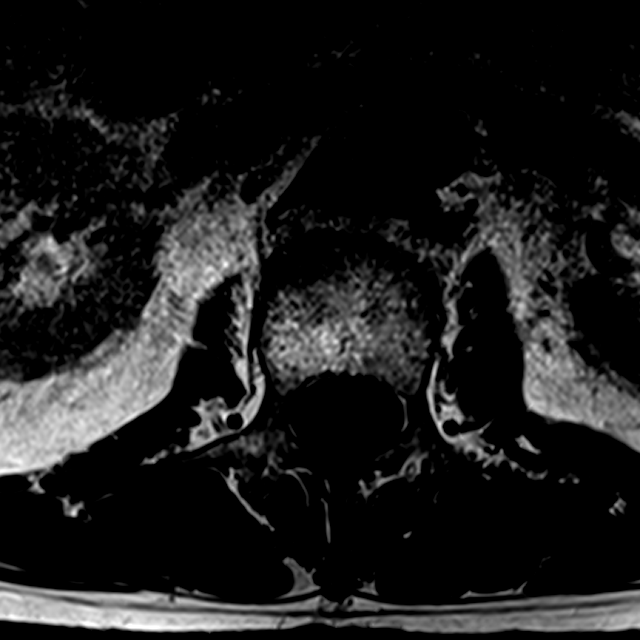

[15 of 48 positions shown; findings below may reference images not displayed]

FINDINGS: Segmentation: There are five lumbar type vertebral bodies. The last
full intervertebral disc space is labeled L5-S1.

Alignment: Degenerative lumbar spondylosis with multilevel
degenerative listhesis unchanged since the prior CT scan.

Vertebrae: Multilevel disc disease and facet disease with endplate
reactive changes, most notable at L1-2. No bone lesions or
fractures.

Conus medullaris and cauda equina: Conus extends to the bottom of
T12 level. Conus and cauda equina appear normal.

Paraspinal and other soft tissues: There is a 4 cm infrarenal
abdominal aortic aneurysm. This measured a maximum of 2.8 cm on a
prior CT scan from 4085. Recommend vascular surgery consultation and
CT angiography of the abdomen/pelvis for further evaluation.

Disc levels:

T12-L1: No significant findings.

L1-2: Advanced degenerate disc disease with a bulging degenerated
annulus and osteophytic ridging. Right foraminal, extraforaminal and
far lateral disc osteophyte complex likely affecting the
extraforaminal right L1 nerve root. There is mild bilateral lateral
recess stenosis, right greater than left. The spinal canal is fairly
generous and there is no significant spinal stenosis.

L2-3: Bulging uncovered disc along with osteophytic ridging with
slight flattening of the ventral thecal sac. Right-sided broad-based
disc osteophyte complex mainly extra foraminally and far laterally
could potentially irritate the right L2 nerve root. Mild right
lateral recess stenosis. No spinal stenosis.

L3-4: Bulging degenerated annulus, moderate facet disease and
ligamentum flavum thickening contributing to mild bilateral lateral
recess stenosis and mild bilateral foraminal stenosis, left greater
than right.

L4-5: Moderate facet disease but no disc protrusions, spinal or
foraminal stenosis.

L5-S1: Moderate facet disease but no disc protrusions, spinal or
foraminal stenosis.
IMPRESSION: 1. Degenerative lumbar spondylosis with multilevel disc disease and
facet disease as detailed above. No significant spinal stenosis but
there is multilevel lateral recess and foraminal stenosis.
2. 4 cm infrarenal abdominal aortic aneurysm incompletely imaged but
definitely increased significantly since 4085. Recommend vascular
surgery consultation and CT angiography abdomen/pelvis for further
evaluation.

## 2020-03-25 IMAGING — DX ORBITS FOR FOREIGN BODY - 2 VIEW
2 series · 2 of 2 positions shown · non-contrast
Comparison: None

CLINICAL DATA: Lumbar radiculopathy, metal exposure to eyes, pre
MRI clearance

EXAM:
ORBITS FOR FOREIGN BODY - 2 VIEW

[orbits pa (1 of 2)]
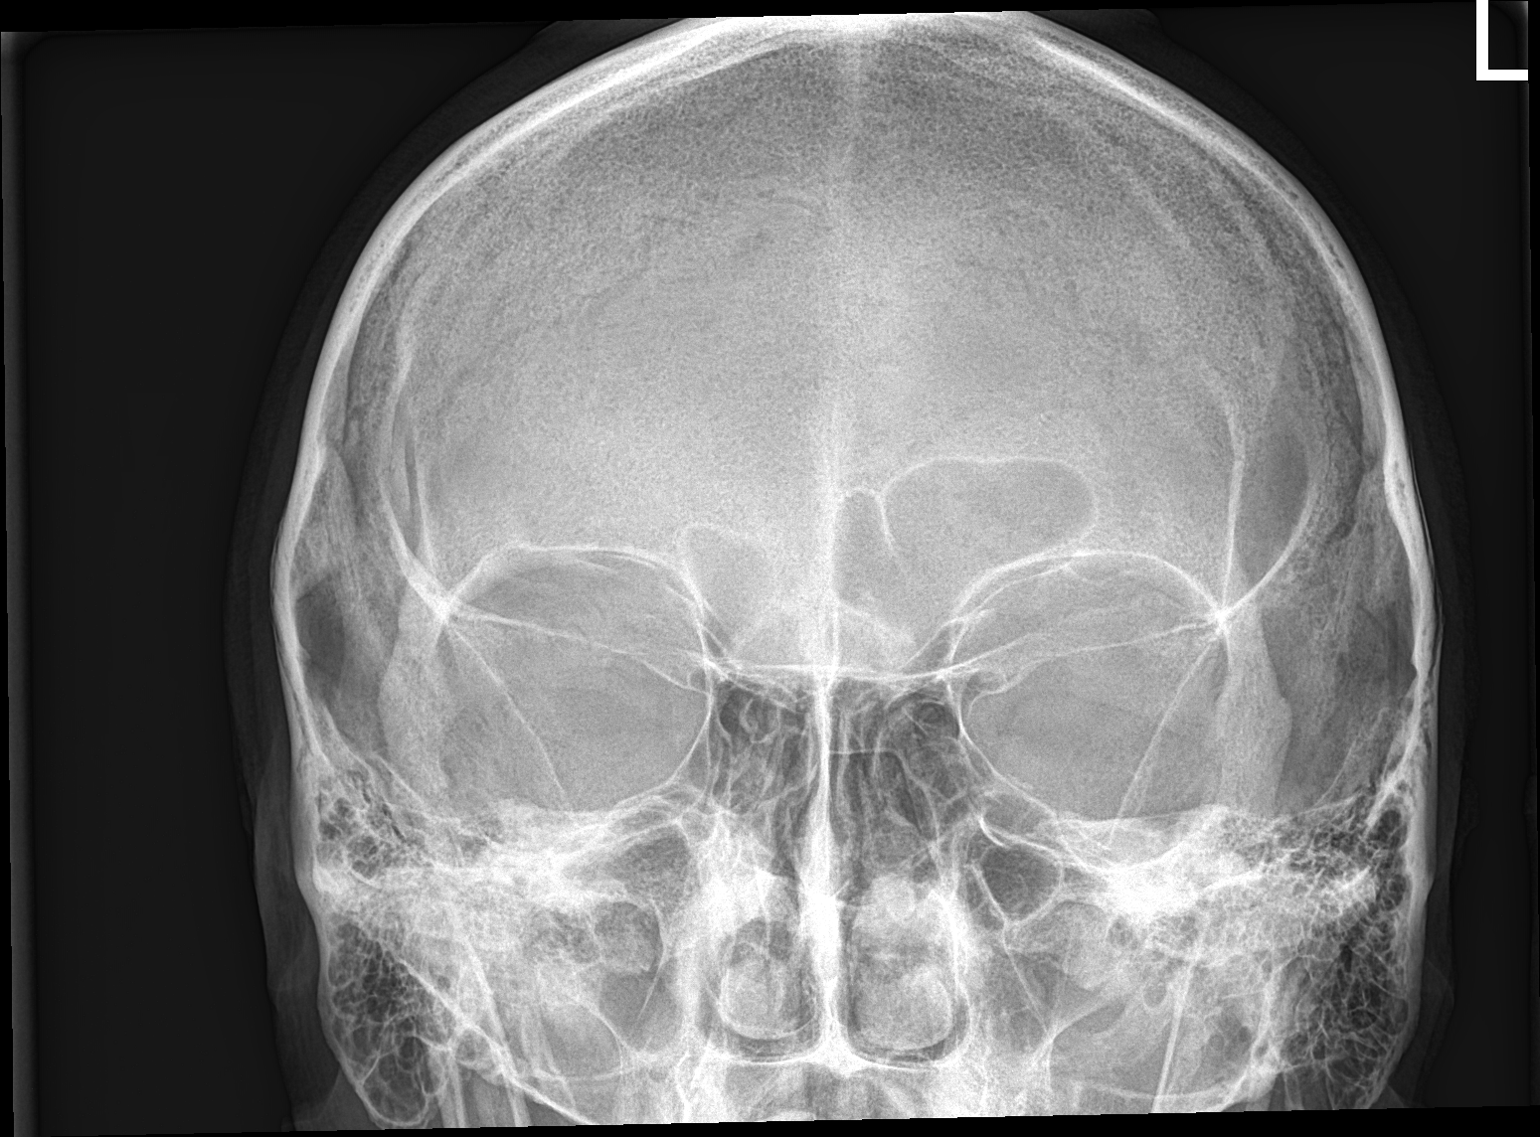

[orbits pa (2 of 2)]
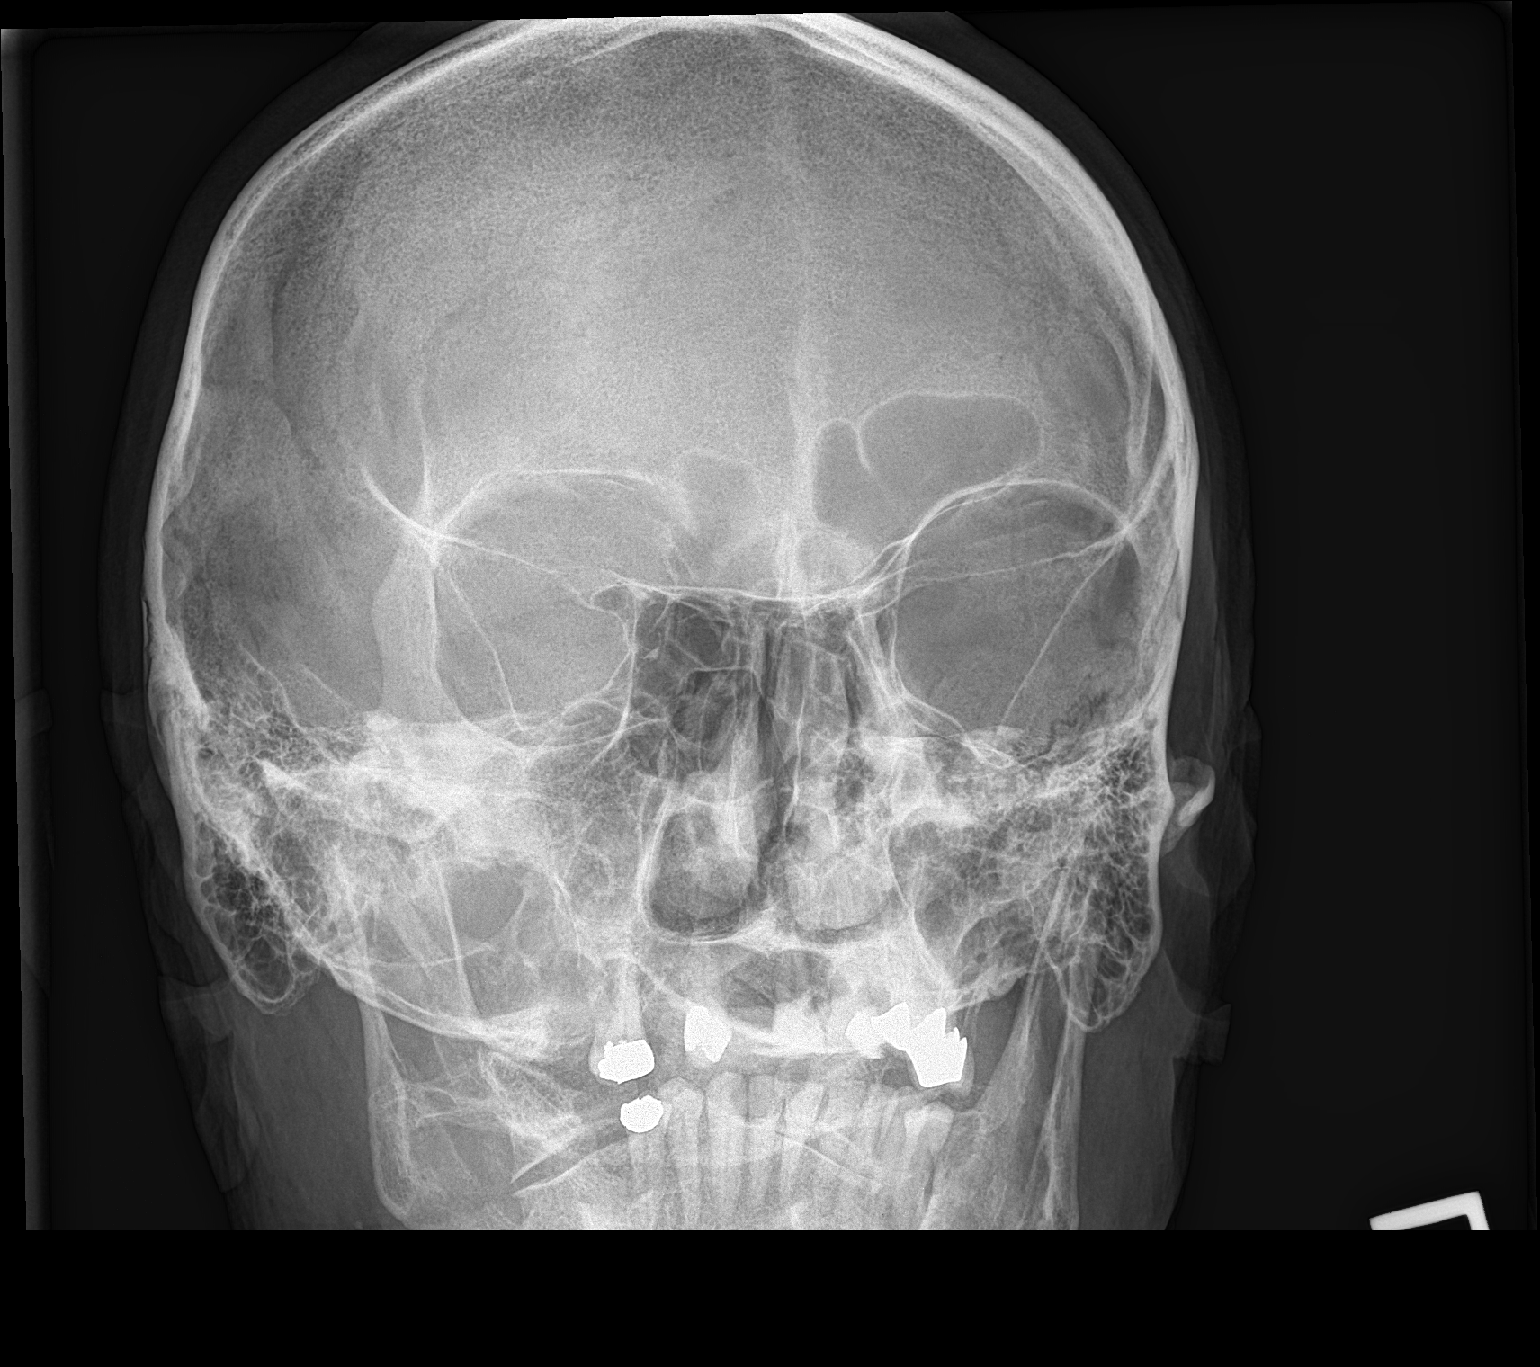

[2 of 2 positions shown; findings below may reference images not displayed]

FINDINGS: No orbital metallic foreign bodies.

Osseous mineralization normal.

Visualized bones and sinuses normal appearance.
IMPRESSION: No orbital metallic foreign bodies identified.

## 2020-04-21 DIAGNOSIS — I259 Chronic ischemic heart disease, unspecified: Secondary | ICD-10-CM | POA: Diagnosis not present

## 2020-04-21 DIAGNOSIS — I10 Essential (primary) hypertension: Secondary | ICD-10-CM | POA: Diagnosis not present

## 2020-04-21 DIAGNOSIS — R7301 Impaired fasting glucose: Secondary | ICD-10-CM | POA: Diagnosis not present

## 2020-04-21 DIAGNOSIS — E782 Mixed hyperlipidemia: Secondary | ICD-10-CM | POA: Diagnosis not present

## 2020-04-28 DIAGNOSIS — R7301 Impaired fasting glucose: Secondary | ICD-10-CM | POA: Diagnosis not present

## 2020-04-28 DIAGNOSIS — F17210 Nicotine dependence, cigarettes, uncomplicated: Secondary | ICD-10-CM | POA: Diagnosis not present

## 2020-04-28 DIAGNOSIS — M25562 Pain in left knee: Secondary | ICD-10-CM | POA: Diagnosis not present

## 2020-04-28 DIAGNOSIS — L989 Disorder of the skin and subcutaneous tissue, unspecified: Secondary | ICD-10-CM | POA: Diagnosis not present

## 2020-04-28 DIAGNOSIS — E782 Mixed hyperlipidemia: Secondary | ICD-10-CM | POA: Diagnosis not present

## 2020-04-28 DIAGNOSIS — K4090 Unilateral inguinal hernia, without obstruction or gangrene, not specified as recurrent: Secondary | ICD-10-CM | POA: Diagnosis not present

## 2020-04-28 DIAGNOSIS — M06059 Rheumatoid arthritis without rheumatoid factor, unspecified hip: Secondary | ICD-10-CM | POA: Diagnosis not present

## 2020-04-28 DIAGNOSIS — R5383 Other fatigue: Secondary | ICD-10-CM | POA: Diagnosis not present

## 2020-04-28 DIAGNOSIS — I259 Chronic ischemic heart disease, unspecified: Secondary | ICD-10-CM | POA: Diagnosis not present

## 2020-04-28 DIAGNOSIS — E785 Hyperlipidemia, unspecified: Secondary | ICD-10-CM | POA: Diagnosis not present

## 2020-04-28 DIAGNOSIS — E7849 Other hyperlipidemia: Secondary | ICD-10-CM | POA: Diagnosis not present

## 2020-04-28 DIAGNOSIS — E875 Hyperkalemia: Secondary | ICD-10-CM | POA: Diagnosis not present

## 2020-05-04 DIAGNOSIS — I10 Essential (primary) hypertension: Secondary | ICD-10-CM | POA: Diagnosis not present

## 2020-05-04 DIAGNOSIS — Z716 Tobacco abuse counseling: Secondary | ICD-10-CM | POA: Diagnosis not present

## 2020-05-04 DIAGNOSIS — M25551 Pain in right hip: Secondary | ICD-10-CM | POA: Diagnosis not present

## 2020-05-04 DIAGNOSIS — E785 Hyperlipidemia, unspecified: Secondary | ICD-10-CM | POA: Diagnosis not present

## 2020-05-04 DIAGNOSIS — I714 Abdominal aortic aneurysm, without rupture: Secondary | ICD-10-CM | POA: Diagnosis not present

## 2020-05-04 DIAGNOSIS — F17210 Nicotine dependence, cigarettes, uncomplicated: Secondary | ICD-10-CM | POA: Diagnosis not present

## 2020-05-04 DIAGNOSIS — M25552 Pain in left hip: Secondary | ICD-10-CM | POA: Diagnosis not present

## 2020-05-04 DIAGNOSIS — R7301 Impaired fasting glucose: Secondary | ICD-10-CM | POA: Diagnosis not present

## 2020-05-04 DIAGNOSIS — I2510 Atherosclerotic heart disease of native coronary artery without angina pectoris: Secondary | ICD-10-CM | POA: Diagnosis not present

## 2020-05-04 DIAGNOSIS — M25561 Pain in right knee: Secondary | ICD-10-CM | POA: Diagnosis not present

## 2020-06-19 DIAGNOSIS — M25552 Pain in left hip: Secondary | ICD-10-CM | POA: Diagnosis not present

## 2020-06-19 DIAGNOSIS — M25561 Pain in right knee: Secondary | ICD-10-CM | POA: Diagnosis not present

## 2020-06-19 DIAGNOSIS — E785 Hyperlipidemia, unspecified: Secondary | ICD-10-CM | POA: Diagnosis not present

## 2020-06-19 DIAGNOSIS — I2510 Atherosclerotic heart disease of native coronary artery without angina pectoris: Secondary | ICD-10-CM | POA: Diagnosis not present

## 2020-06-19 DIAGNOSIS — I714 Abdominal aortic aneurysm, without rupture: Secondary | ICD-10-CM | POA: Diagnosis not present

## 2020-06-19 DIAGNOSIS — I10 Essential (primary) hypertension: Secondary | ICD-10-CM | POA: Diagnosis not present

## 2020-06-19 DIAGNOSIS — M25551 Pain in right hip: Secondary | ICD-10-CM | POA: Diagnosis not present

## 2020-07-19 DIAGNOSIS — I714 Abdominal aortic aneurysm, without rupture: Secondary | ICD-10-CM | POA: Diagnosis not present

## 2020-07-19 DIAGNOSIS — E782 Mixed hyperlipidemia: Secondary | ICD-10-CM | POA: Diagnosis not present

## 2020-07-19 DIAGNOSIS — I10 Essential (primary) hypertension: Secondary | ICD-10-CM | POA: Diagnosis not present

## 2020-07-19 DIAGNOSIS — M25551 Pain in right hip: Secondary | ICD-10-CM | POA: Diagnosis not present

## 2020-07-19 DIAGNOSIS — M25561 Pain in right knee: Secondary | ICD-10-CM | POA: Diagnosis not present

## 2020-07-26 DIAGNOSIS — M06059 Rheumatoid arthritis without rheumatoid factor, unspecified hip: Secondary | ICD-10-CM | POA: Diagnosis not present

## 2020-07-26 DIAGNOSIS — R7301 Impaired fasting glucose: Secondary | ICD-10-CM | POA: Diagnosis not present

## 2020-07-26 DIAGNOSIS — L989 Disorder of the skin and subcutaneous tissue, unspecified: Secondary | ICD-10-CM | POA: Diagnosis not present

## 2020-07-26 DIAGNOSIS — I259 Chronic ischemic heart disease, unspecified: Secondary | ICD-10-CM | POA: Diagnosis not present

## 2020-07-26 DIAGNOSIS — M25562 Pain in left knee: Secondary | ICD-10-CM | POA: Diagnosis not present

## 2020-07-26 DIAGNOSIS — E785 Hyperlipidemia, unspecified: Secondary | ICD-10-CM | POA: Diagnosis not present

## 2020-07-26 DIAGNOSIS — E782 Mixed hyperlipidemia: Secondary | ICD-10-CM | POA: Diagnosis not present

## 2020-07-26 DIAGNOSIS — R5383 Other fatigue: Secondary | ICD-10-CM | POA: Diagnosis not present

## 2020-07-26 DIAGNOSIS — K4090 Unilateral inguinal hernia, without obstruction or gangrene, not specified as recurrent: Secondary | ICD-10-CM | POA: Diagnosis not present

## 2020-07-26 DIAGNOSIS — F17210 Nicotine dependence, cigarettes, uncomplicated: Secondary | ICD-10-CM | POA: Diagnosis not present

## 2020-07-26 DIAGNOSIS — E875 Hyperkalemia: Secondary | ICD-10-CM | POA: Diagnosis not present

## 2020-07-26 DIAGNOSIS — E7849 Other hyperlipidemia: Secondary | ICD-10-CM | POA: Diagnosis not present

## 2020-08-20 DIAGNOSIS — E782 Mixed hyperlipidemia: Secondary | ICD-10-CM | POA: Diagnosis not present

## 2020-08-20 DIAGNOSIS — I714 Abdominal aortic aneurysm, without rupture: Secondary | ICD-10-CM | POA: Diagnosis not present

## 2020-08-20 DIAGNOSIS — R7301 Impaired fasting glucose: Secondary | ICD-10-CM | POA: Diagnosis not present

## 2020-08-20 DIAGNOSIS — I2510 Atherosclerotic heart disease of native coronary artery without angina pectoris: Secondary | ICD-10-CM | POA: Diagnosis not present

## 2020-08-20 DIAGNOSIS — M25552 Pain in left hip: Secondary | ICD-10-CM | POA: Diagnosis not present

## 2020-08-20 DIAGNOSIS — F17200 Nicotine dependence, unspecified, uncomplicated: Secondary | ICD-10-CM | POA: Diagnosis not present

## 2020-08-20 DIAGNOSIS — I10 Essential (primary) hypertension: Secondary | ICD-10-CM | POA: Diagnosis not present

## 2020-08-20 DIAGNOSIS — M25551 Pain in right hip: Secondary | ICD-10-CM | POA: Diagnosis not present

## 2020-09-06 DIAGNOSIS — Z961 Presence of intraocular lens: Secondary | ICD-10-CM | POA: Diagnosis not present

## 2020-09-06 DIAGNOSIS — I10 Essential (primary) hypertension: Secondary | ICD-10-CM | POA: Diagnosis not present

## 2020-09-06 DIAGNOSIS — E782 Mixed hyperlipidemia: Secondary | ICD-10-CM | POA: Diagnosis not present

## 2020-09-06 DIAGNOSIS — R7301 Impaired fasting glucose: Secondary | ICD-10-CM | POA: Diagnosis not present

## 2020-09-06 DIAGNOSIS — M25552 Pain in left hip: Secondary | ICD-10-CM | POA: Diagnosis not present

## 2020-09-06 DIAGNOSIS — I714 Abdominal aortic aneurysm, without rupture: Secondary | ICD-10-CM | POA: Diagnosis not present

## 2020-09-06 DIAGNOSIS — M25551 Pain in right hip: Secondary | ICD-10-CM | POA: Diagnosis not present

## 2020-09-06 DIAGNOSIS — F17200 Nicotine dependence, unspecified, uncomplicated: Secondary | ICD-10-CM | POA: Diagnosis not present

## 2020-09-06 DIAGNOSIS — I2510 Atherosclerotic heart disease of native coronary artery without angina pectoris: Secondary | ICD-10-CM | POA: Diagnosis not present

## 2020-09-13 DIAGNOSIS — L570 Actinic keratosis: Secondary | ICD-10-CM | POA: Diagnosis not present

## 2020-09-13 DIAGNOSIS — X32XXXD Exposure to sunlight, subsequent encounter: Secondary | ICD-10-CM | POA: Diagnosis not present

## 2020-10-04 DIAGNOSIS — I2510 Atherosclerotic heart disease of native coronary artery without angina pectoris: Secondary | ICD-10-CM | POA: Diagnosis not present

## 2020-10-04 DIAGNOSIS — I10 Essential (primary) hypertension: Secondary | ICD-10-CM | POA: Diagnosis not present

## 2020-11-16 ENCOUNTER — Other Ambulatory Visit: Payer: Self-pay

## 2020-11-16 DIAGNOSIS — I714 Abdominal aortic aneurysm, without rupture, unspecified: Secondary | ICD-10-CM

## 2020-11-23 ENCOUNTER — Other Ambulatory Visit: Payer: Self-pay

## 2020-11-23 ENCOUNTER — Ambulatory Visit (HOSPITAL_COMMUNITY)
Admission: RE | Admit: 2020-11-23 | Discharge: 2020-11-23 | Disposition: A | Discharge status: Home / Self Care | Payer: PPO | Source: Ambulatory Visit | Attending: Vascular Surgery | Admitting: Vascular Surgery

## 2020-11-23 ENCOUNTER — Ambulatory Visit: Payer: PPO | Admitting: Physician Assistant

## 2020-11-23 ENCOUNTER — Encounter: Payer: Self-pay | Admitting: Physician Assistant

## 2020-11-23 VITALS — BP 103/69 | HR 64 | Temp 98.2°F | Resp 20 | Ht 69.0 in | Wt 176.8 lb

## 2020-11-23 DIAGNOSIS — F17200 Nicotine dependence, unspecified, uncomplicated: Secondary | ICD-10-CM

## 2020-11-23 DIAGNOSIS — I714 Abdominal aortic aneurysm, without rupture, unspecified: Secondary | ICD-10-CM

## 2020-11-23 DIAGNOSIS — I70213 Atherosclerosis of native arteries of extremities with intermittent claudication, bilateral legs: Secondary | ICD-10-CM

## 2020-11-23 NOTE — Progress Notes (Signed)
Office Note     CC:  follow up Requesting Provider:  Celene Squibb, MD  HPI: [name] is a 75 y.o. (Oct 12, 1945) male who presents for evaluation of abdominal aortic aneurysm.  He was seen 1 year ago and at that time measured 4.2 cm.  He takes pain medication for lower back pain however denies any new abdominal pain.  He also describes claudication of bilateral lower extremities when walking long distances while hunting.  He denies any rest pain or nonhealing wounds of bilateral lower extremities.  He is a daily tobacco user and trying to quit.  He is taking aspirin and statin daily.   Past Medical History:  Diagnosis Date   CAD, NATIVE VESSEL 12/29/2008   Qualifier: Diagnosis of  By: Angelena Form, MD, Christopher     CHEST PAIN-UNSPECIFIED 12/24/2008   Qualifier: Diagnosis of  By: Orville Govern CMA, Carol     Coronary artery disease    Diabetes mellitus without complication ([place])    DYSLIPIDEMIA 12/24/2008   Qualifier: Diagnosis of  By: Orville Govern, CMA, Carol     Hyperlipidemia    Hypertension    MI (myocardial infarction) ([place])    TOBACCO USER 12/24/2008   Qualifier: Diagnosis of  By: Ronne Binning      Past Surgical History:  Procedure Laterality Date   CATARACT EXTRACTION Bilateral    COLONOSCOPY N/A 01/12/2014   Procedure: COLONOSCOPY;  Surgeon: Rogene Houston, MD;  [facility];  Service: Endoscopy;  Laterality: N/A;  22   CORONARY ANGIOPLASTY     HEMORROIDECTOMY     INGUINAL HERNIA REPAIR Right 05/01/2018   Procedure: RIGHT INGUINAL HERNIA REPAIR WITH MESH;  Surgeon: Virl Cagey, MD;  Location: [facility];  Service: General;  Laterality: Right;   right middle finger     reattached from accident    Social History   Socioeconomic History   Marital status: Divorced    Spouse name: Not on file   Number of children: Not on file   Years of education: Not on file   Highest education level: Not on file  Occupational History   Not on file  Tobacco Use   Smoking status:  Every Day    Packs/day: 0.25    Years: 40.00    Pack years: 10.00    Types: Cigarettes   Smokeless tobacco: Never  Vaping Use   Vaping Use: Never used  Substance and Sexual Activity   Alcohol use: Yes    Alcohol/week: 1.0 standard drink    Types: 1 Cans of beer per week   Drug use: No   Sexual activity: Not Currently  Other Topics Concern   Not on file  Social History Narrative   Not on file   Social Determinants of Health   Financial Resource Strain: Not on file  Food Insecurity: Not on file  Transportation Needs: Not on file  Physical Activity: Not on file  Stress: Not on file  Social Connections: Not on file  Intimate Partner Violence: Not on file    Family History  Problem Relation Age of Onset   Alzheimer's disease Mother    Heart attack Father     Current Outpatient Medications  Medication Sig Dispense Refill   acetaminophen (TYLENOL) 325 MG tablet Take 650 mg by mouth every 6 (six) hours as needed for moderate pain.     aspirin EC 81 MG tablet Take 81 mg by mouth daily.     atorvastatin (LIPITOR) 20 MG tablet  Take 20 mg by mouth daily.      docusate sodium (COLACE) 100 MG capsule Take 1 capsule (100 mg total) by mouth 2 (two) times daily. 60 capsule 1   gabapentin (NEURONTIN) 100 MG capsule Take 1 capsule (100 mg total) by mouth 3 (three) times daily. 90 capsule 0   HYDROcodone-acetaminophen (NORCO/VICODIN) 5-325 MG tablet Take 1 tablet by mouth every 8 (eight) hours as needed. 12 tablet 0   ibuprofen (ADVIL) 800 MG tablet Take 800 mg by mouth 3 (three) times daily as needed.     lisinopril (PRINIVIL,ZESTRIL) 20 MG tablet Take 20 mg by mouth daily.     meloxicam (MOBIC) 15 MG tablet Take 15 mg by mouth daily as needed (arthritis).      Menthol, Topical Analgesic, (ICY HOT EX) Apply 1 application topically daily as needed (pain).     metoprolol succinate (TOPROL-XL) 25 MG 24 hr tablet TAKE 1 TABLET BY MOUTH ONCE DAILY. 90 tablet 3   nitroGLYCERIN (NITROSTAT) 0.4  MG SL tablet Place under the tongue.     Omega-3 Fatty Acids (FISH OIL) 500 MG CAPS Take 500 mg by mouth daily.     traMADol (ULTRAM) 50 MG tablet Take 1 tablet (50 mg total) by mouth every 6 (six) hours as needed for severe pain. 15 tablet 0   dexamethasone (DECADRON) 4 MG tablet Take 1 tablet (4 mg total) by mouth 2 (two) times daily. (Patient not taking: Reported on 11/23/2020) 8 tablet 0   No current facility-administered medications for this visit.    No Known Allergies   REVIEW OF SYSTEMS:   '[X]'$  denotes positive finding, '[ ]'$  denotes negative finding Cardiac  Comments:  Chest pain or chest pressure:    Shortness of breath upon exertion:    Short of breath when lying flat:    Irregular heart rhythm:        Vascular    Pain in calf, thigh, or hip brought on by ambulation:    Pain in feet at night that wakes you up from your sleep:     Blood clot in your veins:    Leg swelling:         Pulmonary    Oxygen at home:    Productive cough:     Wheezing:         Neurologic    Sudden weakness in arms or legs:     Sudden numbness in arms or legs:     Sudden onset of difficulty speaking or slurred speech:    Temporary loss of vision in one eye:     Problems with dizziness:         Gastrointestinal    Blood in stool:     Vomited blood:         Genitourinary    Burning when urinating:     Blood in urine:        Psychiatric    Major depression:         Hematologic    Bleeding problems:    Problems with blood clotting too easily:        Skin    Rashes or ulcers:        Constitutional    Fever or chills:      PHYSICAL EXAMINATION:  Vitals:   11/23/20 0858  BP: 103/69  Pulse: 64  Resp: 20  Temp: 98.2 F (36.8 C)  TempSrc: Temporal  SpO2: 99%  Weight: 176 lb 12.8 oz (80.2 kg)  Height: '5\' 9"'$  (1.753 m)    General:  WDWN in NAD; vital signs documented above Gait: Not observed HENT: WNL, normocephalic Pulmonary: normal non-labored breathing , without Rales,  rhonchi,  wheezing Cardiac: regular HR Abdomen: soft, NT, no masses Skin: without rashes Vascular Exam/Pulses:  Right Left  Radial 2+ (normal) 2+ (normal)  DP absent absent  PT absent absent   Extremities: without ischemic changes, without Gangrene , without cellulitis; without open wounds;  Musculoskeletal: no muscle wasting or atrophy  Neurologic: A&O X 3;  No focal weakness or paresthesias are detected Psychiatric:  The pt has Normal affect.   Non-Invasive Vascular Imaging:   4.24 cm AAA at its largest diameter; unchanged over 1 year    ASSESSMENT/PLAN:: 75 y.o. male here for follow up for surveillance of AAA  -AAA size is unchanged over the past year -No indication for repair currently -Patient also with claudication of both calves however no rest pain or non healing wounds -Encouraged walking to promote collateral circulation -Encouraged smoking cessation -We will check an ABI in 1 year in addition to AAA duplex   Dagoberto Ligas, PA-C [facility] [phone]  Clinic MD:   Oneida Alar

## 2020-12-18 NOTE — Progress Notes (Deleted)
Cardiology Office Note    Date:  12/18/2020   ID:  [name], DOB 1945/12/30, MRN [id number]   PCP:  Zachary Squibb, MD   [place]  Cardiologist:  Zachary Chandler, MD   Advanced Practice Provider:  No care team member to display Electrophysiologist:  None   [id number]   No chief complaint on file.   History of Present Illness:  [name] is a 75 y.o. male with history ofCAD status post NSTEMI 11/2008 treated with large BMS to the mid circumflex with moderate residual disease in mid RCA, NST 2018 no ischemia normal LVEF, hypertension, HLD, DM2, AAA 4.1 x 4.2 followed by Dr. Oneida Lutz, tobacco abuse.  Patient last saw Dr. Angelena Lutz 09/08/2019 at which time he was doing well.  No changes made.    Past Medical History:  Diagnosis Date   CAD, NATIVE VESSEL 12/29/2008   Qualifier: Diagnosis of  By: Zachary Form, MD, [name]     CHEST PAIN-UNSPECIFIED 12/24/2008   Qualifier: Diagnosis of  By: [name] CMA, [name]     Coronary artery disease    Diabetes mellitus without complication ([place])    DYSLIPIDEMIA 12/24/2008   Qualifier: Diagnosis of  By: [name], CMA, [name]     Hyperlipidemia    Hypertension    MI (myocardial infarction) ([place])    TOBACCO USER 12/24/2008   Qualifier: Diagnosis of  By: Ronne Binning      Past Surgical History:  Procedure Laterality Date   CATARACT EXTRACTION Bilateral    COLONOSCOPY N/A 01/12/2014   Procedure: COLONOSCOPY;  Surgeon: Zachary Houston, MD;  [facility];  Service: Endoscopy;  Laterality: N/A;  1030   CORONARY ANGIOPLASTY     HEMORROIDECTOMY     INGUINAL HERNIA REPAIR Right 05/01/2018   Procedure: RIGHT INGUINAL HERNIA REPAIR WITH MESH;  Surgeon: Virl Cagey, MD;  Location: [facility];  Service: General;  Laterality: Right;   right middle finger     reattached from accident    Current Medications: No outpatient medications have been marked as taking for the 12/20/20 encounter (Appointment) with  Zachary Burn, PA-C.     Allergies:   Patient has no known allergies.   Social History   Socioeconomic History   Marital status: Divorced    Spouse name: Not on file   Number of children: Not on file   Years of education: Not on file   Highest education level: Not on file  Occupational History   Not on file  Tobacco Use   Smoking status: Every Day    Packs/day: 0.25    Years: 40.00    Pack years: 10.00    Types: Cigarettes   Smokeless tobacco: Never  Vaping Use   Vaping Use: Never used  Substance and Sexual Activity   Alcohol use: Yes    Alcohol/week: 1.0 standard drink    Types: 1 Cans of beer per week   Drug use: No   Sexual activity: Not Currently  Other Topics Concern   Not on file  Social History Narrative   Not on file   Social Determinants of Health   Financial Resource Strain: Not on file  Food Insecurity: Not on file  Transportation Needs: Not on file  Physical Activity: Not on file  Stress: Not on file  Social Connections: Not on file     Family History:  The patient's ***family history includes Alzheimer's disease in his mother; Heart attack in his father.  ROS:   Please see the history of present illness.    ROS All other systems reviewed and are negative.   PHYSICAL EXAM:   VS:  There were no vitals taken for this visit.  Physical Exam  GEN: Well nourished, well developed, in no acute distress  HEENT: normal  Neck: no JVD, carotid bruits, or masses Cardiac:RRR; no murmurs, rubs, or gallops  Respiratory:  clear to auscultation bilaterally, normal work of breathing GI: soft, nontender, nondistended, + BS Ext: without cyanosis, clubbing, or edema, Good distal pulses bilaterally MS: no deformity or atrophy  Skin: warm and dry, no rash Neuro:  Alert and Oriented x 3, Strength and sensation are intact Psych: euthymic mood, full affect  Wt Readings from Last 3 Encounters:  11/23/20 176 lb 12.8 oz (80.2 kg)  11/18/19 185 lb 6.4 oz (84.1  kg)  09/08/19 187 lb (84.8 kg)      Studies/Labs Reviewed:   EKG:  EKG is*** ordered today.  The ekg ordered today demonstrates ***  Recent Labs: No results found for requested labs within last 8760 hours.   Lipid Panel    Component Value Date/Time   CHOL 106 05/17/2009 0122   TRIG 111 05/17/2009 0122   HDL 26 (L) 05/17/2009 0122   CHOLHDL 4.1 Ratio 05/17/2009 0122   VLDL 22 05/17/2009 0122   LDLCALC 58 05/17/2009 0122    Additional studies/ records that were reviewed today include:  NST 2018 Nuclear stress EF: 56%. The study is normal. This is a low risk study. There was no ST segment deviation noted during stress.   Low risk stress nuclear study with normal perfusion and normal left ventricular regional and global systolic function.    Abdominal ultrasound 11/23/2020 Summary:  Abdominal Aorta: The largest aortic measurement is 4.1 x 4.2 cm.  There is a residual lumen of 2.84 x 2.60 cm.     *See table(s) above for measurements and observations.    Electronically signed by Zachary Hinds MD on 11/23/2020 at 10:22:26 AM.       Risk Assessment/Calculations:   {Does this patient have ATRIAL FIBRILLATION?:[id number]}     ASSESSMENT:    No diagnosis found.   PLAN:  In order of problems listed above:  CAD status post NSTEMI 11/2008 treated with large BMS to the mid circumflex with moderate residual disease in mid RCA, NST 2018 no ischemia normal LVEF  Hypertension  Hyperlipidemia  Diabetes mellitus  Tobacco abuse  Shared Decision Making/Informed Consent   {Are you ordering a CV Procedure (e.g. stress test, cath, DCCV, TEE, etc)?   Press F2        :[id number]    Medication Adjustments/Labs and Tests Ordered: Current medicines are reviewed at length with the patient today.  Concerns regarding medicines are outlined above.  Medication changes, Labs and Tests ordered today are listed in the Patient Instructions below. There are no Patient Instructions on  file for this visit.   Zachary Boast, PA-C  12/18/2020 2:39 PM    [facility] [address], [place], [place]  [address] Phone: [phone]; Fax: [phone]

## 2020-12-20 ENCOUNTER — Ambulatory Visit: Payer: PPO | Admitting: Physician Assistant

## 2020-12-20 DIAGNOSIS — E785 Hyperlipidemia, unspecified: Secondary | ICD-10-CM

## 2020-12-20 DIAGNOSIS — I2510 Atherosclerotic heart disease of native coronary artery without angina pectoris: Secondary | ICD-10-CM | POA: Diagnosis not present

## 2020-12-20 DIAGNOSIS — I10 Essential (primary) hypertension: Secondary | ICD-10-CM

## 2020-12-20 DIAGNOSIS — F17200 Nicotine dependence, unspecified, uncomplicated: Secondary | ICD-10-CM

## 2021-02-19 DIAGNOSIS — I2510 Atherosclerotic heart disease of native coronary artery without angina pectoris: Secondary | ICD-10-CM | POA: Diagnosis not present

## 2021-02-19 DIAGNOSIS — I10 Essential (primary) hypertension: Secondary | ICD-10-CM | POA: Diagnosis not present

## 2021-03-26 DIAGNOSIS — Z0000 Encounter for general adult medical examination without abnormal findings: Secondary | ICD-10-CM | POA: Diagnosis not present

## 2021-03-26 DIAGNOSIS — R7301 Impaired fasting glucose: Secondary | ICD-10-CM | POA: Diagnosis not present

## 2021-03-26 DIAGNOSIS — I10 Essential (primary) hypertension: Secondary | ICD-10-CM | POA: Diagnosis not present

## 2021-03-30 DIAGNOSIS — E782 Mixed hyperlipidemia: Secondary | ICD-10-CM | POA: Diagnosis not present

## 2021-03-30 DIAGNOSIS — F17200 Nicotine dependence, unspecified, uncomplicated: Secondary | ICD-10-CM | POA: Diagnosis not present

## 2021-03-30 DIAGNOSIS — Z0001 Encounter for general adult medical examination with abnormal findings: Secondary | ICD-10-CM | POA: Diagnosis not present

## 2021-03-30 DIAGNOSIS — M25551 Pain in right hip: Secondary | ICD-10-CM | POA: Diagnosis not present

## 2021-03-30 DIAGNOSIS — M25552 Pain in left hip: Secondary | ICD-10-CM | POA: Diagnosis not present

## 2021-03-30 DIAGNOSIS — I2510 Atherosclerotic heart disease of native coronary artery without angina pectoris: Secondary | ICD-10-CM | POA: Diagnosis not present

## 2021-03-30 DIAGNOSIS — R7301 Impaired fasting glucose: Secondary | ICD-10-CM | POA: Diagnosis not present

## 2021-03-30 DIAGNOSIS — I714 Abdominal aortic aneurysm, without rupture, unspecified: Secondary | ICD-10-CM | POA: Diagnosis not present

## 2021-03-30 DIAGNOSIS — I10 Essential (primary) hypertension: Secondary | ICD-10-CM | POA: Diagnosis not present

## 2021-06-04 DIAGNOSIS — M25551 Pain in right hip: Secondary | ICD-10-CM | POA: Diagnosis not present

## 2021-06-04 DIAGNOSIS — M545 Low back pain, unspecified: Secondary | ICD-10-CM | POA: Diagnosis not present

## 2021-08-10 ENCOUNTER — Ambulatory Visit: Payer: PPO | Admitting: Nurse Practitioner

## 2021-08-10 DIAGNOSIS — M545 Low back pain, unspecified: Secondary | ICD-10-CM | POA: Diagnosis not present

## 2021-08-10 DIAGNOSIS — R0989 Other specified symptoms and signs involving the circulatory and respiratory systems: Secondary | ICD-10-CM | POA: Diagnosis not present

## 2021-08-10 DIAGNOSIS — R059 Cough, unspecified: Secondary | ICD-10-CM | POA: Diagnosis not present

## 2021-08-10 DIAGNOSIS — J029 Acute pharyngitis, unspecified: Secondary | ICD-10-CM | POA: Diagnosis not present

## 2021-08-28 NOTE — Progress Notes (Signed)
?Cardiology Office Note:   ? ?Date:  08/31/2021  ? ?ID:  [name], DOB 1945-07-09, MRN [id number] ? ?PCP:  Celene Squibb, MD ?  ?[facility] Providers ?Cardiologist:  Lauree Chandler, MD    ? ?Referring MD: Celene Squibb, MD  ? ?Chief Complaint: annual follow-up CAD ? ?History of Present Illness:   ? ?[name] is a very pleasant 76 y.o. male with a hx of CAD, tobacco abuse, diabetes, hypertension, hyperlipidemia, and AAA.  ? ?Admission August 2010 for chest pain, found to have cardiac enzymes indicating NSTEMI. Cardiac cath 12/08/2008 revealed normal left main and LAD, RCA had 60% stenosis, circumflex with 99% haziness in midportion with significant thrombus burden, treated with PTCA and thrombectomy along with BMS.  LV function normal at that time. Repeat echo 09/2009 normal EF. Nuclear stress test July 2018 no ischemia ? ?He was last seen in our office 09/08/2019 by Dr. Angelena Form at which time no changes were made to his treatment plan and 1 year follow-up was recommended .  ? ?Today, he is here alone. Reports he has chronic back pain and pain in his pelvis, has an old pelvic fracture. 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  Also has occasional pain in his chest from holding the guitar. Walks a lot during the day and does some stretching. Has noted more shortness of breath with activity. Does not feel like his prior angina. Denies edema, orthopnea, or PND. Has left leg pain and notes tingling in that leg and foot and foot goes numb more easily than his right foot. Is having a dry cough that wakes him up at night. He denies lower extremity edema, fatigue, palpitations, melena, hematuria, hemoptysis, diaphoresis, weakness, presyncope, or syncope. States his girlfriend is a Marine scientist and told him it might be the lisinopril. Has had it for several years. They mostly cook at home, but he also admits that he loves bacon and salts his food. Home BP readings  350-093 systolic, 81-82 diastolic.  ? ?Past Medical History:  ?Diagnosis Date  ? CAD, NATIVE VESSEL 12/29/2008  ? Qualifier: Diagnosis of  By: Angelena Form, MD, Harrell Gave    ? CHEST PAIN-UNSPECIFIED 12/24/2008  ? Qualifier: Diagnosis of  By: Ronne Binning    ? Coronary artery disease   ? Diabetes mellitus without complication ([place])   ? DYSLIPIDEMIA 12/24/2008  ? Qualifier: Diagnosis of  By: Ronne Binning    ? Hyperlipidemia   ? Hypertension   ? MI (myocardial infarction) ([place])   ? TOBACCO USER 12/24/2008  ? Qualifier: Diagnosis of  By: Orville Govern CMA, Carol    ? ? ?Past Surgical History:  ?Procedure Laterality Date  ? CATARACT EXTRACTION Bilateral   ? COLONOSCOPY N/A 01/12/2014  ? Procedure: COLONOSCOPY;  Surgeon: Rogene Houston, MD;  [facility];  Service: Endoscopy;  Laterality: N/A;  1030  ? CORONARY ANGIOPLASTY    ? HEMORROIDECTOMY    ? INGUINAL HERNIA REPAIR Right 05/01/2018  ? Procedure: RIGHT INGUINAL HERNIA REPAIR WITH MESH;  Surgeon: Virl Cagey, MD;  Location: [facility];  Service: General;  Laterality: Right;  ? right middle finger    ? reattached from accident  ? ? ?Current Medications: ?Current Meds  ?Medication Sig  ? acetaminophen (TYLENOL) 325 MG tablet Take 650 mg by mouth every 6 (six) hours as needed for moderate pain.  ? aspirin EC 81 MG tablet Take 81  mg by mouth daily.  ? atorvastatin (LIPITOR) 20 MG tablet Take 20 mg by mouth daily.   ? dexamethasone (DECADRON) 4 MG tablet Take 1 tablet (4 mg total) by mouth 2 (two) times daily.  ? docusate sodium (COLACE) 100 MG capsule Take 1 capsule (100 mg total) by mouth 2 (two) times daily.  ? gabapentin (NEURONTIN) 100 MG capsule Take 1 capsule (100 mg total) by mouth 3 (three) times daily.  ? HYDROcodone-acetaminophen (NORCO/VICODIN) 5-325 MG tablet Take 1 tablet by mouth every 8 (eight) hours as needed.  ? ibuprofen (ADVIL) 800 MG tablet Take 800 mg by mouth 3 (three) times daily as needed.  ? meloxicam (MOBIC) 15 MG tablet Take 15 mg by mouth  daily as needed (arthritis).   ? Menthol, Topical Analgesic, (ICY HOT EX) Apply 1 application topically daily as needed (pain).  ? metoprolol succinate (TOPROL-XL) 25 MG 24 hr tablet TAKE 1 TABLET BY MOUTH ONCE DAILY.  ? nitroGLYCERIN (NITROSTAT) 0.4 MG SL tablet Place under the tongue.  ? Omega-3 Fatty Acids (FISH OIL) 500 MG CAPS Take 500 mg by mouth daily.  ? traMADol (ULTRAM) 50 MG tablet Take 1 tablet (50 mg total) by mouth every 6 (six) hours as needed for severe pain.  ? valsartan (DIOVAN) 40 MG tablet Take 1 tablet (40 mg total) by mouth daily.  ? [DISCONTINUED] lisinopril (PRINIVIL,ZESTRIL) 20 MG tablet Take 20 mg by mouth daily.  ?  ? ?Allergies:   Patient has no known allergies.  ? ?Social History  ? ?Socioeconomic History  ? Marital status: Divorced  ?  Spouse name: Not on file  ? Number of children: Not on file  ? Years of education: Not on file  ? Highest education level: Not on file  ?Occupational History  ? Not on file  ?Tobacco Use  ? Smoking status: Every Day  ?  Packs/day: 0.25  ?  Years: 40.00  ?  Pack years: 10.00  ?  Types: Cigarettes  ? Smokeless tobacco: Never  ?Vaping Use  ? Vaping Use: Never used  ?Substance and Sexual Activity  ? Alcohol use: Yes  ?  Alcohol/week: 1.0 standard drink  ?  Types: 1 Cans of beer per week  ? Drug use: No  ? Sexual activity: Not Currently  ?Other Topics Concern  ? Not on file  ?Social History Narrative  ? Not on file  ? ?Social Determinants of Health  ? ?Financial Resource Strain: Not on file  ?Food Insecurity: Not on file  ?Transportation Needs: Not on file  ?Physical Activity: Not on file  ?Stress: Not on file  ?Social Connections: Not on file  ?  ? ?Family History: ?The patient's family history includes Alzheimer's disease in his mother; Heart attack in his father. ? ?ROS:   ?Please see the history of present illness.    ?+ DOE ?+ back and pelvis pain ?All other systems reviewed and are negative. ? ?Labs/Other Studies Reviewed:   ? ?The following studies were  reviewed today: ? ?VAS AAA Duplex 11/23/20 ? ?Summary:  ?Abdominal Aorta: The largest aortic measurement is 4.1 x 4.2 cm.  ?There is a residual lumen of 2.84 x 2.60 cm.  ? ? ?Lexiscan myoview 10/2016 ? ?Nuclear stress EF: 56%. ?The study is normal. ?This is a low risk study. ?There was no ST segment deviation noted during stress. ?  ?Low risk stress nuclear study with normal perfusion and normal left ventricular regional and global systolic function. ?  ?[place] 11/28/2008  ? ?  Normal left main and LAD, dominant RCA had 60% stenosis, circumflex with 99% haziness in midportion with significant thrombus burden, treated with PTCA and thrombectomy along with BMS.  ? ? ?Recent Labs: ?From [facility] 03/26/2021 ?A1C 5.7 ?Creatinine 1.03 ?K+ 4.8 ?Hgb 15.4 ? ?Recent Lipid Panel ?Total cholesterol 134 ?HDL 32 ?LDL 87 ?Trigs 78 ? ?Risk Assessment/Calculations:   ?  ? ?Physical Exam:   ? ?VS:  BP 110/80 (BP Location: Left Arm, Patient Position: Sitting, Cuff Size: Normal)   Pulse 77   Ht '5\' 9"'$  (1.753 m)   Wt 173 lb (78.5 kg)   SpO2 98%   BMI 25.55 kg/m?    ? ?Wt Readings from Last 3 Encounters:  ?08/31/21 173 lb (78.5 kg)  ?11/23/20 176 lb 12.8 oz (80.2 kg)  ?11/18/19 185 lb 6.4 oz (84.1 kg)  ?  ? ?GEN:  Well nourished, well developed in no acute distress ?HEENT: Normal ?NECK: No JVD; No carotid bruits ?CARDIAC: RRR, no murmurs, rubs, gallops ?RESPIRATORY:  Clear to auscultation without rales, wheezing or rhonchi  ?ABDOMEN: Soft, non-tender, non-distended ?MUSCULOSKELETAL:  No edema; No deformity. 2+ pedal pulses, equal bilaterally ?SKIN: Warm and dry ?NEUROLOGIC:  Alert and oriented x 3 ?PSYCHIATRIC:  Normal affect  ? ?EKG:  EKG is ordered today.  The ekg ordered today demonstrates NSR at 77 bpm, nonspecific intraventricular block, no acute change from previous ? ?Diagnoses:   ? ?1. DOE (dyspnea on exertion)   ?2. Coronary artery disease involving native coronary artery of native heart without angina pectoris   ?3. Hyperlipidemia LDL goal  <70   ?4. Essential hypertension   ?5. Abdominal aortic aneurysm (AAA) without rupture, unspecified part ([place])   ?6. Claudication of left lower extremity ([place])   ?7. Tobacco abuse   ? ?Assessment and Pl

## 2021-08-31 ENCOUNTER — Encounter: Payer: Self-pay | Admitting: Nurse Practitioner

## 2021-08-31 ENCOUNTER — Ambulatory Visit: Payer: PPO | Admitting: Nurse Practitioner

## 2021-08-31 VITALS — BP 110/80 | HR 77 | Ht 69.0 in | Wt 173.0 lb

## 2021-08-31 DIAGNOSIS — I714 Abdominal aortic aneurysm, without rupture, unspecified: Secondary | ICD-10-CM

## 2021-08-31 DIAGNOSIS — Z720 Tobacco use: Secondary | ICD-10-CM | POA: Diagnosis not present

## 2021-08-31 DIAGNOSIS — I10 Essential (primary) hypertension: Secondary | ICD-10-CM

## 2021-08-31 DIAGNOSIS — I739 Peripheral vascular disease, unspecified: Secondary | ICD-10-CM

## 2021-08-31 DIAGNOSIS — R0609 Other forms of dyspnea: Secondary | ICD-10-CM | POA: Diagnosis not present

## 2021-08-31 DIAGNOSIS — I2510 Atherosclerotic heart disease of native coronary artery without angina pectoris: Secondary | ICD-10-CM | POA: Diagnosis not present

## 2021-08-31 DIAGNOSIS — E785 Hyperlipidemia, unspecified: Secondary | ICD-10-CM

## 2021-08-31 MED ORDER — VALSARTAN 40 MG PO TABS: 40.0000 mg | ORAL_TABLET | Freq: Every day | ORAL | 3 refills | Status: DC

## 2021-08-31 NOTE — Patient Instructions (Addendum)
Medication Instructions:  ?Your physician has recommended you make the following change in your medication:  ?STOP LISINOPRIL ?START VALSARTAN 40 MG ON Monday, MAY 15TH  ?*If you need a refill on your cardiac medications before your next appointment, please call your pharmacy* ? ? ?Lab Work: ?YOUR PROVIDER RECOMMENDS THAT YOUR LDL SHOULD BE LESS THAN 70. WHEN YOU YOU GET LAB WORK DONE BY DR. HALL, IF YOUR LDL IN ABOVE 70 THAN WE MAY INCREASE ATORVASTATIN.    ? ?If you have labs (blood work) drawn today and your tests are completely normal, you will receive your results only by: ?MyChart Message (if you have MyChart) OR ?A paper copy in the mail ?If you have any lab test that is abnormal or we need to change your treatment, we will call you to review the results. ? ? ?Testing/Procedures: ?Your physician has requested that you have an echocardiogram. Echocardiography is a painless test that uses sound waves to create images of your heart. It provides your doctor with information about the size and shape of your heart and how well your heart?s chambers and valves are working. This procedure takes approximately one hour. There are no restrictions for this procedure.  ? ? ?Follow-Up: ?At [facility], you and your health needs are our priority.  As part of our continuing mission to provide you with exceptional heart care, we have created designated Provider Care Teams.  These Care Teams include your primary Cardiologist (physician) and Advanced Practice Providers (APPs -  Physician Assistants and Nurse Practitioners) who all work together to provide you with the care you need, when you need it. ? ?We recommend signing up for the patient portal called "MyChart".  Sign up information is provided on this After Visit Summary.  MyChart is used to connect with patients for Virtual Visits (Telemedicine).  Patients are able to view lab/test results, encounter notes, upcoming appointments, etc.  Non-urgent messages can be sent  to your provider as well.   ?To learn more about what you can do with MyChart, go to [email or web address].   ? ?Your next appointment:   ?1 year(s) ? ?The format for your next appointment:   ?In Person ? ?Provider:   ?Lauree Chandler, MD { ? ? ?Other Instructions ? ?Adopting a Healthy Lifestyle. ?  ?Weight: Know what a healthy weight is for you (roughly BMI <25) and aim to maintain this. You can calculate your body mass index on your smart phone ? ?Diet: Aim for 7+ servings of fruits and vegetables daily ?Limit animal fats in diet for cholesterol and heart health - choose grass fed whenever available ?Avoid highly processed foods (fast food burgers, tacos, fried chicken, pizza, hot dogs, french fries)  ?Saturated fat comes in the form of butter, lard, coconut oil, margarine, partially hydrogenated oils, and fat in meat. These increase your risk of cardiovascular disease.  ?Use healthy plant oils, such as olive, canola, soy, corn, sunflower and peanut.  ?Whole foods such as fruits, vegetables and whole grains have fiber  ?Men need > 38 grams of fiber per day ?Women need > 25 grams of fiber per day  ?Load up on vegetables and fruits - one-half of your plate: Aim for color and variety, and remember that potatoes don?t count. ?Go for whole grains - one-quarter of your plate: Whole wheat, barley, wheat berries, quinoa, oats, brown rice, and foods made with them. If you want pasta, go with whole wheat pasta. ?Protein power - one-quarter of your plate: Fish, chicken, beans,  and nuts are all healthy, versatile protein sources. Limit red meat. ?You need carbohydrates for energy! The type of carbohydrate is more important than the amount. Choose carbohydrates such as vegetables, fruits, whole grains, beans, and nuts in the place of white rice, white pasta, potatoes (baked or fried), macaroni and cheese, cakes, cookies, and donuts.  ?If you?re thirsty, drink water. Coffee and tea are good in moderation, but skip sugary  drinks and limit milk and dairy products to one or two daily servings. ?Keep sugar intake at 6 teaspoons or 24 grams or LESS  ? ? ? ?  ?Exercise: Aim for 150 min of moderate intensity exercise weekly for heart health, and weights twice weekly for bone health ?Stay active - any steps are better than no steps! ?Aim for 7-9 hours of sleep daily ?  ? ?  ? ? ?Important Information About Sugar ? ? ? ? ?  ?

## 2021-09-03 ENCOUNTER — Telehealth: Payer: Self-pay | Admitting: Cardiovascular Disease

## 2021-09-03 NOTE — Telephone Encounter (Signed)
Spoke with pt who repots he stopped Lisinopril on Friday and started Valsartan '40mg'$  today.  Pt states he took medication about 8am and dizziness started around 11am.  He denies current CP, SOB or fainting.  He states it is more than lightheadedness but the room is not spinning.  Pt states he took his BP about 30 minutes ago and reading is 105/67 with HR of 89.  Pt states this is a lower reading than normal for him.  He states he has eaten 2 meals today and is drinking fluids.  He does report his cough is much improved since stopping Lisinopril. ?Pt advised will forward to Christen Bame, NP for review and recommendation.  Provided education on medication side effects.  Encouraged pt to have a small salty snack since his BP is lower than what he is used to and to continue to hydrate with water.  Pt verbalizes understanding and agrees with current plan ? ?

## 2021-09-03 NOTE — Telephone Encounter (Signed)
Pt c/o medication issue: ? ?1. Name of Medication: VALSARTAN 40 MG ? ?2. How are you currently taking this medication (dosage and times per day)? Started taking today as prescribed  ? ?3. Are you having a reaction (difficulty breathing--STAT)? Yes. ? ?4. What is your medication issue?  Pt's medication was changed at his last appt on the 12th of May. He was taking Lisinopril. States he was to start medicine today, but says the new medication seems to be making him dizzy. Would like a call back.  ?

## 2021-09-04 NOTE — Telephone Encounter (Signed)
I'm glad his cough is better. I would encourage him to split the pill in half and take 1/2 in morning and 1/2 at night for a while. He needs to hydrate well with water.  If he is able to tolerate the medication like this for a few days, then he can try a full tablet once daily again. Ask him to call back if he is unable to tolerate.  ?

## 2021-09-04 NOTE — Telephone Encounter (Signed)
I am not sure why message was sent to the preop call back pool. I will forward back to sender.  ?

## 2021-09-04 NOTE — Telephone Encounter (Signed)
S/w pt will try [name]'s recommendations and will call back after pt goes back to a full tablet daily if pt is having issues.  ?

## 2021-09-21 ENCOUNTER — Ambulatory Visit (HOSPITAL_COMMUNITY): Payer: PPO | Attending: Cardiology

## 2021-09-21 DIAGNOSIS — R0609 Other forms of dyspnea: Secondary | ICD-10-CM | POA: Diagnosis not present

## 2021-09-21 LAB — ECHOCARDIOGRAM COMPLETE
Area-P 1/2: 3.85 cm2
S' Lateral: 2.7 cm

## 2021-09-26 DIAGNOSIS — E782 Mixed hyperlipidemia: Secondary | ICD-10-CM | POA: Diagnosis not present

## 2021-09-26 DIAGNOSIS — R7301 Impaired fasting glucose: Secondary | ICD-10-CM | POA: Diagnosis not present

## 2021-10-01 DIAGNOSIS — I714 Abdominal aortic aneurysm, without rupture, unspecified: Secondary | ICD-10-CM | POA: Diagnosis not present

## 2021-10-01 DIAGNOSIS — M25552 Pain in left hip: Secondary | ICD-10-CM | POA: Diagnosis not present

## 2021-10-01 DIAGNOSIS — H6121 Impacted cerumen, right ear: Secondary | ICD-10-CM | POA: Diagnosis not present

## 2021-10-01 DIAGNOSIS — I2510 Atherosclerotic heart disease of native coronary artery without angina pectoris: Secondary | ICD-10-CM | POA: Diagnosis not present

## 2021-10-01 DIAGNOSIS — E782 Mixed hyperlipidemia: Secondary | ICD-10-CM | POA: Diagnosis not present

## 2021-10-01 DIAGNOSIS — M25551 Pain in right hip: Secondary | ICD-10-CM | POA: Diagnosis not present

## 2021-10-01 DIAGNOSIS — R7301 Impaired fasting glucose: Secondary | ICD-10-CM | POA: Diagnosis not present

## 2021-10-01 DIAGNOSIS — F17200 Nicotine dependence, unspecified, uncomplicated: Secondary | ICD-10-CM | POA: Diagnosis not present

## 2021-10-01 DIAGNOSIS — I10 Essential (primary) hypertension: Secondary | ICD-10-CM | POA: Diagnosis not present

## 2022-02-20 DIAGNOSIS — Z961 Presence of intraocular lens: Secondary | ICD-10-CM | POA: Diagnosis not present

## 2022-02-20 DIAGNOSIS — H2512 Age-related nuclear cataract, left eye: Secondary | ICD-10-CM | POA: Diagnosis not present

## 2022-03-29 DIAGNOSIS — R7301 Impaired fasting glucose: Secondary | ICD-10-CM | POA: Diagnosis not present

## 2022-03-29 DIAGNOSIS — E782 Mixed hyperlipidemia: Secondary | ICD-10-CM | POA: Diagnosis not present

## 2022-04-03 DIAGNOSIS — Z0000 Encounter for general adult medical examination without abnormal findings: Secondary | ICD-10-CM | POA: Diagnosis not present

## 2022-04-03 DIAGNOSIS — Z125 Encounter for screening for malignant neoplasm of prostate: Secondary | ICD-10-CM | POA: Diagnosis not present

## 2022-04-03 DIAGNOSIS — I714 Abdominal aortic aneurysm, without rupture, unspecified: Secondary | ICD-10-CM | POA: Diagnosis not present

## 2022-04-03 DIAGNOSIS — I10 Essential (primary) hypertension: Secondary | ICD-10-CM | POA: Diagnosis not present

## 2022-04-03 DIAGNOSIS — E782 Mixed hyperlipidemia: Secondary | ICD-10-CM | POA: Diagnosis not present

## 2022-04-03 DIAGNOSIS — M25552 Pain in left hip: Secondary | ICD-10-CM | POA: Diagnosis not present

## 2022-04-03 DIAGNOSIS — R7301 Impaired fasting glucose: Secondary | ICD-10-CM | POA: Diagnosis not present

## 2022-04-03 DIAGNOSIS — I2510 Atherosclerotic heart disease of native coronary artery without angina pectoris: Secondary | ICD-10-CM | POA: Diagnosis not present

## 2022-04-03 DIAGNOSIS — F17200 Nicotine dependence, unspecified, uncomplicated: Secondary | ICD-10-CM | POA: Diagnosis not present

## 2022-04-03 DIAGNOSIS — M25551 Pain in right hip: Secondary | ICD-10-CM | POA: Diagnosis not present

## 2022-04-05 ENCOUNTER — Other Ambulatory Visit: Payer: Self-pay | Admitting: *Deleted

## 2022-04-05 DIAGNOSIS — I714 Abdominal aortic aneurysm, without rupture, unspecified: Secondary | ICD-10-CM

## 2022-04-17 ENCOUNTER — Ambulatory Visit (INDEPENDENT_AMBULATORY_CARE_PROVIDER_SITE_OTHER): Payer: PPO | Admitting: Physician Assistant

## 2022-04-17 ENCOUNTER — Ambulatory Visit (HOSPITAL_COMMUNITY)
Admission: RE | Admit: 2022-04-17 | Discharge: 2022-04-17 | Disposition: A | Discharge status: Home / Self Care | Payer: PPO | Source: Ambulatory Visit | Attending: Vascular Surgery | Admitting: Vascular Surgery

## 2022-04-17 VITALS — BP 135/88 | HR 77 | Temp 98.1°F | Ht 69.0 in | Wt 175.0 lb

## 2022-04-17 DIAGNOSIS — F17200 Nicotine dependence, unspecified, uncomplicated: Secondary | ICD-10-CM

## 2022-04-17 DIAGNOSIS — I714 Abdominal aortic aneurysm, without rupture, unspecified: Secondary | ICD-10-CM

## 2022-04-17 NOTE — Progress Notes (Signed)
Office Note     CC:  follow up Requesting Provider:  Celene Squibb, MD  HPI: [name] is a 76 y.o. (1945/10/06) male who presents for surveillance of AAA.  He was last seen in office in August 2022 and at that time AAA measured 4.2 cm by duplex.  He is on pain medication for chronic lower back pain however denies any new abdominal pain.  He denies any significant claudication.  He also denies any rest pain or tissue loss of bilateral lower extremities.  He is down to about 1/2 pack a day and is trying to quit.  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  He is on aspirin and statin daily.   Past Medical History:  Diagnosis Date   CAD, NATIVE VESSEL 12/29/2008   Qualifier: Diagnosis of  By: Angelena Form, MD, Christopher     CHEST PAIN-UNSPECIFIED 12/24/2008   Qualifier: Diagnosis of  By: Orville Govern CMA, Carol     Coronary artery disease    Diabetes mellitus without complication ([place])    DYSLIPIDEMIA 12/24/2008   Qualifier: Diagnosis of  By: Orville Govern, CMA, Carol     Hyperlipidemia    Hypertension    MI (myocardial infarction) ([place])    TOBACCO USER 12/24/2008   Qualifier: Diagnosis of  By: Ronne Binning      Past Surgical History:  Procedure Laterality Date   CATARACT EXTRACTION Bilateral    COLONOSCOPY N/A 01/12/2014   Procedure: COLONOSCOPY;  Surgeon: Rogene Houston, MD;  Location: [facility];  Service: Endoscopy;  Laterality: N/A;  29   CORONARY ANGIOPLASTY     HEMORROIDECTOMY     INGUINAL HERNIA REPAIR Right 05/01/2018   Procedure: RIGHT INGUINAL HERNIA REPAIR WITH MESH;  Surgeon: Virl Cagey, MD;  Location: [facility] ORS;  Service: General;  Laterality: Right;   right middle finger     reattached from accident    Social History   Socioeconomic History   Marital status: Divorced    Spouse name: Not on file   Number of children: Not on file   Years of education: Not on file   Highest education level: Not on file  Occupational History   Not on file  Tobacco Use   Smoking  status: Every Day    Packs/day: 0.25    Years: 40.00    Total pack years: 10.00    Types: Cigarettes   Smokeless tobacco: Never  Vaping Use   Vaping Use: Never used  Substance and Sexual Activity   Alcohol use: Yes    Alcohol/week: 1.0 standard drink of alcohol    Types: 1 Cans of beer per week   Drug use: No   Sexual activity: Not Currently  Other Topics Concern   Not on file  Social History Narrative   Not on file   Social Determinants of Health   Financial Resource Strain: Not on file  Food Insecurity: Not on file  Transportation Needs: Not on file  Physical Activity: Not on file  Stress: Not on file  Social Connections: Not on file  Intimate Partner Violence: Not on file    Family History  Problem Relation Age of Onset   Alzheimer's disease Mother    Heart attack Father     Current Outpatient Medications  Medication Sig Dispense Refill   acetaminophen (TYLENOL) 325 MG tablet Take 650 mg by mouth every 6 (six) hours as needed for moderate pain.     aspirin EC 81 MG tablet  Take 81 mg by mouth daily.     atorvastatin (LIPITOR) 20 MG tablet Take 20 mg by mouth daily.      dexamethasone (DECADRON) 4 MG tablet Take 1 tablet (4 mg total) by mouth 2 (two) times daily. 8 tablet 0   docusate sodium (COLACE) 100 MG capsule Take 1 capsule (100 mg total) by mouth 2 (two) times daily. 60 capsule 1   gabapentin (NEURONTIN) 100 MG capsule Take 1 capsule (100 mg total) by mouth 3 (three) times daily. 90 capsule 0   HYDROcodone-acetaminophen (NORCO/VICODIN) 5-325 MG tablet Take 1 tablet by mouth every 8 (eight) hours as needed. 12 tablet 0   ibuprofen (ADVIL) 800 MG tablet Take 800 mg by mouth 3 (three) times daily as needed.     meloxicam (MOBIC) 15 MG tablet Take 15 mg by mouth daily as needed (arthritis).      Menthol, Topical Analgesic, (ICY HOT EX) Apply 1 application topically daily as needed (pain).     metoprolol succinate (TOPROL-XL) 25 MG 24 hr tablet TAKE 1 TABLET BY  MOUTH ONCE DAILY. 90 tablet 3   nitroGLYCERIN (NITROSTAT) 0.4 MG SL tablet Place under the tongue.     Omega-3 Fatty Acids (FISH OIL) 500 MG CAPS Take 500 mg by mouth daily.     valsartan (DIOVAN) 40 MG tablet Take 1 tablet (40 mg total) by mouth daily. 90 tablet 3   No current facility-administered medications for this visit.    No Known Allergies   REVIEW OF SYSTEMS:   '[X]'$  denotes positive finding, '[ ]'$  denotes negative finding Cardiac  Comments:  Chest pain or chest pressure:    Shortness of breath upon exertion:    Short of breath when lying flat:    Irregular heart rhythm:        Vascular    Pain in calf, thigh, or hip brought on by ambulation:    Pain in feet at night that wakes you up from your sleep:     Blood clot in your veins:    Leg swelling:         Pulmonary    Oxygen at home:    Productive cough:     Wheezing:         Neurologic    Sudden weakness in arms or legs:     Sudden numbness in arms or legs:     Sudden onset of difficulty speaking or slurred speech:    Temporary loss of vision in one eye:     Problems with dizziness:         Gastrointestinal    Blood in stool:     Vomited blood:         Genitourinary    Burning when urinating:     Blood in urine:        Psychiatric    Major depression:         Hematologic    Bleeding problems:    Problems with blood clotting too easily:        Skin    Rashes or ulcers:        Constitutional    Fever or chills:      PHYSICAL EXAMINATION:  Vitals:   04/17/22 0842  BP: 135/88  Pulse: 77  Temp: 98.1 F (36.7 C)  TempSrc: Temporal  SpO2: 99%  Weight: 175 lb (79.4 kg)  Height: '5\' 9"'$  (1.753 m)    General:  WDWN in NAD; vital signs documented above Gait: Not  observed HENT: WNL, normocephalic Pulmonary: normal non-labored breathing , without Rales, rhonchi,  wheezing Cardiac: regular HR Abdomen: soft, NT, no masses Skin: without rashes Vascular Exam/Pulses:  Right Left  DP absent absent   PT absent absent   Extremities: without ischemic changes, without Gangrene , without cellulitis; without open wounds;  Musculoskeletal: no muscle wasting or atrophy  Neurologic: A&O X 3;  No focal weakness or paresthesias are detected Psychiatric:  The pt has Normal affect.   Non-Invasive Vascular Imaging:   4.44cm AAA at largest diameter    ASSESSMENT/PLAN:: 76 y.o. male here for follow up for surveillance of AAA  -Slight increase of AAA size from 4.24 to 4.44 cm based on duplex performed this morning.  This remains asymptomatic.  He will continue his aspirin and statin daily.  He will continue regular follow-up with his PCP for management of chronic medical conditions including hypertension and hyperlipidemia.  I encouraged him to stop smoking.  He is down to about 1/2 pack of cigarettes a day and is trying to quit.  He will follow-up in 6 months for repeat AAA duplex.   Dagoberto Ligas, PA-C [facility] [phone]  Clinic MD:   Scot Dock

## 2022-04-24 ENCOUNTER — Other Ambulatory Visit: Payer: Self-pay

## 2022-04-24 DIAGNOSIS — I714 Abdominal aortic aneurysm, without rupture, unspecified: Secondary | ICD-10-CM

## 2022-08-13 ENCOUNTER — Ambulatory Visit: Payer: PPO | Admitting: General Surgery

## 2022-09-27 DIAGNOSIS — R7301 Impaired fasting glucose: Secondary | ICD-10-CM | POA: Diagnosis not present

## 2022-09-27 DIAGNOSIS — E782 Mixed hyperlipidemia: Secondary | ICD-10-CM | POA: Diagnosis not present

## 2022-09-27 DIAGNOSIS — Z125 Encounter for screening for malignant neoplasm of prostate: Secondary | ICD-10-CM | POA: Diagnosis not present

## 2022-09-29 LAB — LAB REPORT - SCANNED
A1c: 5.9
Albumin, Urine POC: 9
Creatinine, POC: 66.7 mg/dL
EGFR: 90
Microalb Creat Ratio: 13

## 2022-10-03 ENCOUNTER — Encounter: Payer: Self-pay | Admitting: Internal Medicine

## 2022-10-03 DIAGNOSIS — Z9889 Other specified postprocedural states: Secondary | ICD-10-CM | POA: Diagnosis not present

## 2022-10-03 DIAGNOSIS — M25552 Pain in left hip: Secondary | ICD-10-CM | POA: Diagnosis not present

## 2022-10-03 DIAGNOSIS — M25551 Pain in right hip: Secondary | ICD-10-CM | POA: Diagnosis not present

## 2022-10-03 DIAGNOSIS — M545 Low back pain, unspecified: Secondary | ICD-10-CM | POA: Diagnosis not present

## 2022-10-03 DIAGNOSIS — R7303 Prediabetes: Secondary | ICD-10-CM | POA: Diagnosis not present

## 2022-10-03 DIAGNOSIS — G8929 Other chronic pain: Secondary | ICD-10-CM | POA: Diagnosis not present

## 2022-10-03 DIAGNOSIS — I714 Abdominal aortic aneurysm, without rupture, unspecified: Secondary | ICD-10-CM | POA: Diagnosis not present

## 2022-10-03 DIAGNOSIS — E782 Mixed hyperlipidemia: Secondary | ICD-10-CM | POA: Diagnosis not present

## 2022-10-03 DIAGNOSIS — Z0001 Encounter for general adult medical examination with abnormal findings: Secondary | ICD-10-CM | POA: Diagnosis not present

## 2022-10-03 DIAGNOSIS — I700 Atherosclerosis of aorta: Secondary | ICD-10-CM | POA: Diagnosis not present

## 2022-10-03 DIAGNOSIS — I2510 Atherosclerotic heart disease of native coronary artery without angina pectoris: Secondary | ICD-10-CM | POA: Diagnosis not present

## 2022-10-03 DIAGNOSIS — I10 Essential (primary) hypertension: Secondary | ICD-10-CM | POA: Diagnosis not present

## 2022-11-15 ENCOUNTER — Other Ambulatory Visit: Payer: Self-pay | Admitting: Nurse Practitioner

## 2022-12-17 NOTE — Progress Notes (Signed)
Cardiology Office Note:  .   Date:  12/31/2022  ID:  [name], DOB 1945-09-26, MRN [id number] PCP: Benita Stabile, MD  [facility] Providers Cardiologist:  Verne Carrow, MD    History of Present Illness: .   [name] is a 77 y.o. male  with a hx of CAD 2010 NSTEMI treated with PTCA and thrombectomy along with BMS normal left main and LAD, RCA had 60% stenosis, circumflex , LV function normal at that time. Repeat echo 09/2009 normal EF. Nuclear stress test July 2018 no ischemia, tobacco abuse, diabetes, hypertension, hyperlipidemia, and AAA.   Last seen 08/2021 with DOE and echo normal LVEF G1DD. Metoprolol restarted.   Patient denies chest pain, dyspnea, palpitations, edema. Walks 1/2-1 mile daily.   ROS:    Studies Reviewed: Marland Kitchen    EKG Interpretation Date/Time:  Tuesday December 31 2022 11:25:05 EDT Ventricular Rate:  78 PR Interval:  188 QRS Duration:  152 QT Interval:  416 QTC Calculation: 474 R Axis:   99  Text Interpretation: Normal sinus rhythm Right bundle branch block When compared with ECG of 09-Dec-2008 05:29, No significant change was found Confirmed by Jacolyn Reedy [phone]) on 12/31/2022 11:47:02 AM    Prior CV Studies: No results found for this or any previous visit from the past 3650 days.   No results found for this or any previous visit from the past 3650 days.    Echo 09/2021  IMPRESSIONS     1. Left ventricular ejection fraction, by estimation, is 60 to 65%. The  left ventricle has normal function. The left ventricle has no regional  wall motion abnormalities. Left ventricular diastolic parameters are  consistent with Grade I diastolic  dysfunction (impaired relaxation).   2. Right ventricular systolic function is normal. The right ventricular  size is normal.   3. The mitral valve is normal in structure. Trivial mitral valve  regurgitation. No evidence of mitral stenosis. There is mild holosystolic  prolapse of the middle segment of  the anterior leaflet of the mitral  valve.   4. The aortic valve is tricuspid. There is moderate calcification of the  aortic valve. There is mild thickening of the aortic valve. Aortic valve  regurgitation is not visualized. Aortic valve sclerosis/calcification is  present, without any evidence of  aortic stenosis.   5. The inferior vena cava is normal in size with greater than 50%  respiratory variability, suggesting right atrial pressure of 3 mmHg.  Risk Assessment/Calculations:             Physical Exam:   VS:  BP 138/84   Pulse 76   Ht 5\' 9"  (1.753 m)   Wt 177 lb (80.3 kg)   SpO2 95%   BMI 26.14 kg/m    Wt Readings from Last 3 Encounters:  12/31/22 177 lb (80.3 kg)  04/17/22 175 lb (79.4 kg)  08/31/21 173 lb (78.5 kg)    GEN: Well nourished, well developed in no acute distress NECK: No JVD; No carotid bruits CARDIAC:  RRR, no murmurs, rubs, gallops RESPIRATORY:  Clear to auscultation without rales, wheezing or rhonchi  ABDOMEN: Soft, non-tender, non-distended EXTREMITIES:  No edema; No deformity   ASSESSMENT AND PLAN: .     CAD without angina: He denies chest pain.continue ASA, lipitor, toprol and diovan. Labs reviewed in [facility] and stable 09/2022.   Hyperlipidemia LDL goal < 70: LDL 64 in 09/2022.     Hypertension: BP is well controlled.  Tobacco abuse: Smoke 1/2 ppd or less,  Complete cessation advised.      AAA: 4.4 cm at largest diameter by ultrasound 03/2022-followed by vascular and due for f/u.         Dispo: f/u Dr. Clifton James 1 yr.  Signed, Jacolyn Reedy, PA-C

## 2022-12-31 ENCOUNTER — Encounter: Payer: Self-pay | Admitting: Physician Assistant

## 2022-12-31 ENCOUNTER — Ambulatory Visit: Payer: PPO | Attending: Physician Assistant | Admitting: Physician Assistant

## 2022-12-31 VITALS — BP 138/84 | HR 76 | Ht 69.0 in | Wt 177.0 lb

## 2022-12-31 DIAGNOSIS — Z720 Tobacco use: Secondary | ICD-10-CM | POA: Diagnosis not present

## 2022-12-31 DIAGNOSIS — I10 Essential (primary) hypertension: Secondary | ICD-10-CM

## 2022-12-31 DIAGNOSIS — I714 Abdominal aortic aneurysm, without rupture, unspecified: Secondary | ICD-10-CM | POA: Diagnosis not present

## 2022-12-31 DIAGNOSIS — I2510 Atherosclerotic heart disease of native coronary artery without angina pectoris: Secondary | ICD-10-CM

## 2022-12-31 DIAGNOSIS — E785 Hyperlipidemia, unspecified: Secondary | ICD-10-CM

## 2022-12-31 NOTE — Patient Instructions (Addendum)
Medication Instructions:  Your physician recommends that you continue on your current medications as directed. Please refer to the Current Medication list given to you today.   Labwork: None today  Testing/Procedures: None today  Follow-Up: 6 months with Dr.McAlhany   Please call Dr.Dixon at [facility] for follow up appointment  [phone]  Any Other Special Instructions Will Be Listed Below (If Applicable).  If you need a refill on your cardiac medications before your next appointment, please call your pharmacy.   Steps to Quit Smoking Smoking tobacco is the leading cause of preventable death. It can affect almost every organ in the body. Smoking puts you and people around you at risk for many serious, long-lasting (chronic) diseases. Quitting smoking can be hard, but it is one of the best things that you can do for your health. It is never too late to quit. Do not give up if you cannot quit the first time. Some people need to try many times to quit. Do your best to stick to your quit plan, and talk with your doctor if you have any questions or concerns. How do I get ready to quit? Pick a date to quit. Set a date within the next 2 weeks to give you time to prepare. Write down the reasons why you are quitting. Keep this list in places where you will see it often. Tell your family, friends, and co-workers that you are quitting. Their support is important. Talk with your doctor about the choices that may help you quit. Find out if your health insurance will pay for these treatments. Know the people, places, things, and activities that make you want to smoke (triggers). Avoid them. What first steps can I take to quit smoking? Throw away all cigarettes at home, at work, and in your car. Throw away the things that you use when you smoke, such as ashtrays and lighters. Clean your car. Empty the ashtray. Clean your home, including curtains and carpets. What can I do to help me quit  smoking? Talk with your doctor about taking medicines and seeing a counselor. You are more likely to succeed when you do both. If you are pregnant or breastfeeding: Talk with your doctor about counseling or other ways to quit smoking. Do not take medicine to help you quit smoking unless your doctor tells you to. Quit right away Quit smoking completely, instead of slowly cutting back on how much you smoke over a period of time. Stopping smoking right away may be more successful than slowly quitting. Go to counseling. In-person is best if this is an option. You are more likely to quit if you go to counseling sessions regularly. Take medicine You may take medicines to help you quit. Some medicines need a prescription, and some you can buy over-the-counter. Some medicines may contain a drug called nicotine to replace the nicotine in cigarettes. Medicines may: Help you stop having the desire to smoke (cravings). Help to stop the problems that come when you stop smoking (withdrawal symptoms). Your doctor may ask you to use: Nicotine patches, gum, or lozenges. Nicotine inhalers or sprays. Non-nicotine medicine that you take by mouth. Find resources Find resources and other ways to help you quit smoking and remain smoke-free after you quit. They include: Online chats with a Veterinary surgeon. Phone quitlines. Printed Materials engineer. Support groups or group counseling. Text messaging programs. Mobile phone apps. Use apps on your mobile phone or tablet that can help you stick to your quit plan. Examples of free  services include Quit Guide from the [employer] and smokefree.gov  What can I do to make it easier to quit?  Talk to your family and friends. Ask them to support and encourage you. Call a phone quitline, such as 1-800-QUIT-NOW, reach out to support groups, or work with a Veterinary surgeon. Ask people who smoke to not smoke around you. Avoid places that make you want to smoke, such  as: Bars. Parties. Smoke-break areas at work. Spend time with people who do not smoke. Lower the stress in your life. Stress can make you want to smoke. Try these things to lower stress: Getting regular exercise. Doing deep-breathing exercises. Doing yoga. Meditating. What benefits will I see if I quit smoking? Over time, you may have: A better sense of smell and taste. Less coughing and sore throat. A slower heart rate. Lower blood pressure. Clearer skin. Better breathing. Fewer sick days. Summary Quitting smoking can be hard, but it is one of the best things that you can do for your health. Do not give up if you cannot quit the first time. Some people need to try many times to quit. When you decide to quit smoking, make a plan to help you succeed. Quit smoking right away, not slowly over a period of time. When you start quitting, get help and support to keep you smoke-free. This information is not intended to replace advice given to you by your health care provider. Make sure you discuss any questions you have with your health care provider. Document Revised: 03/30/2021 Document Reviewed: 03/30/2021 Elsevier Patient Education  2024 [employer].

## 2023-02-18 DIAGNOSIS — M65312 Trigger thumb, left thumb: Secondary | ICD-10-CM | POA: Diagnosis not present

## 2023-02-18 DIAGNOSIS — M1812 Unilateral primary osteoarthritis of first carpometacarpal joint, left hand: Secondary | ICD-10-CM | POA: Diagnosis not present

## 2023-02-18 DIAGNOSIS — M19042 Primary osteoarthritis, left hand: Secondary | ICD-10-CM | POA: Diagnosis not present

## 2023-02-18 DIAGNOSIS — M79645 Pain in left finger(s): Secondary | ICD-10-CM | POA: Diagnosis not present

## 2023-03-31 DIAGNOSIS — E782 Mixed hyperlipidemia: Secondary | ICD-10-CM | POA: Diagnosis not present

## 2023-03-31 DIAGNOSIS — R7303 Prediabetes: Secondary | ICD-10-CM | POA: Diagnosis not present

## 2023-04-01 LAB — LAB REPORT - SCANNED
A1c: 5.9
Albumin, Urine POC: 6.7
Creatinine, POC: 46.7 mg/dL
EGFR: 83
Microalb Creat Ratio: 14

## 2023-04-04 DIAGNOSIS — F17210 Nicotine dependence, cigarettes, uncomplicated: Secondary | ICD-10-CM | POA: Diagnosis not present

## 2023-04-04 DIAGNOSIS — M545 Low back pain, unspecified: Secondary | ICD-10-CM | POA: Diagnosis not present

## 2023-04-04 DIAGNOSIS — M25551 Pain in right hip: Secondary | ICD-10-CM | POA: Diagnosis not present

## 2023-04-04 DIAGNOSIS — I700 Atherosclerosis of aorta: Secondary | ICD-10-CM | POA: Diagnosis not present

## 2023-04-04 DIAGNOSIS — I2510 Atherosclerotic heart disease of native coronary artery without angina pectoris: Secondary | ICD-10-CM | POA: Diagnosis not present

## 2023-04-04 DIAGNOSIS — E782 Mixed hyperlipidemia: Secondary | ICD-10-CM | POA: Diagnosis not present

## 2023-04-04 DIAGNOSIS — Z125 Encounter for screening for malignant neoplasm of prostate: Secondary | ICD-10-CM | POA: Diagnosis not present

## 2023-04-04 DIAGNOSIS — R7303 Prediabetes: Secondary | ICD-10-CM | POA: Diagnosis not present

## 2023-04-04 DIAGNOSIS — M25552 Pain in left hip: Secondary | ICD-10-CM | POA: Diagnosis not present

## 2023-04-04 DIAGNOSIS — E875 Hyperkalemia: Secondary | ICD-10-CM | POA: Diagnosis not present

## 2023-04-04 DIAGNOSIS — I10 Essential (primary) hypertension: Secondary | ICD-10-CM | POA: Diagnosis not present

## 2023-04-04 DIAGNOSIS — I714 Abdominal aortic aneurysm, without rupture, unspecified: Secondary | ICD-10-CM | POA: Diagnosis not present

## 2023-04-07 ENCOUNTER — Encounter: Payer: Self-pay | Admitting: Internal Medicine

## 2023-04-25 ENCOUNTER — Other Ambulatory Visit: Payer: Self-pay | Admitting: Cardiovascular Disease

## 2023-05-19 ENCOUNTER — Other Ambulatory Visit: Payer: Self-pay

## 2023-05-19 DIAGNOSIS — I714 Abdominal aortic aneurysm, without rupture, unspecified: Secondary | ICD-10-CM

## 2023-05-19 NOTE — Addendum Note (Signed)
Addended by: Terrence Dupont A on: 05/19/2023 10:48 AM   Modules accepted: Orders

## 2023-05-20 ENCOUNTER — Ambulatory Visit (INDEPENDENT_AMBULATORY_CARE_PROVIDER_SITE_OTHER): Payer: PPO

## 2023-05-20 ENCOUNTER — Ambulatory Visit: Payer: PPO | Admitting: Physician Assistant

## 2023-05-20 VITALS — BP 153/92 | HR 73 | Temp 97.7°F | Resp 18 | Ht 69.0 in | Wt 176.0 lb

## 2023-05-20 DIAGNOSIS — I714 Abdominal aortic aneurysm, without rupture, unspecified: Secondary | ICD-10-CM

## 2023-05-20 DIAGNOSIS — F17200 Nicotine dependence, unspecified, uncomplicated: Secondary | ICD-10-CM

## 2023-05-20 NOTE — Progress Notes (Signed)
Office Note     CC:  follow up Requesting Provider:  Benita Stabile, MD  HPI: [name] is a 78 y.o. (05-05-1945) male who presents for surveillance of abdominal aortic aneurysm.  He denies any new or changing abdominal or back pain.  Chronic back pain is unchanged.  He denies any claudication.  He also denies any rest pain or tissue loss of bilateral lower extremities.  He still smokes about 1/2 pack a day.  Surgical history significant for right inguinal hernia repair with mesh in 2020.  Cardiac history significant for NSTEMI in 2010 treated with coronary angioplasty.  Stress test in 2018 was negative for reversible ischemia.  Echo in May 2023 demonstrated normal EF.  He denies any chest pain or significant shortness of breath with exertion.  He takes a daily aspirin and statin.    Past Medical History:  Diagnosis Date   CAD, NATIVE VESSEL 12/29/2008   Qualifier: Diagnosis of  By: Clifton James, MD, Christopher     CHEST PAIN-UNSPECIFIED 12/24/2008   Qualifier: Diagnosis of  By: Denyse Amass CMA, Carol     Coronary artery disease    Diabetes mellitus without complication (HCC)    DYSLIPIDEMIA 12/24/2008   Qualifier: Diagnosis of  By: Denyse Amass, CMA, Carol     Hyperlipidemia    Hypertension    MI (myocardial infarction) (HCC)    TOBACCO USER 12/24/2008   Qualifier: Diagnosis of  By: Wendee Copp      Past Surgical History:  Procedure Laterality Date   CATARACT EXTRACTION Bilateral    COLONOSCOPY N/A 01/12/2014   Procedure: COLONOSCOPY;  Surgeon: Malissa Hippo, MD;  Location: [facility];  Service: Endoscopy;  Laterality: N/A;  1030   CORONARY ANGIOPLASTY     HEMORROIDECTOMY     INGUINAL HERNIA REPAIR Right 05/01/2018   Procedure: RIGHT INGUINAL HERNIA REPAIR WITH MESH;  Surgeon: Lucretia Roers, MD;  Location: [facility];  Service: General;  Laterality: Right;   right middle finger     reattached from accident    Social History   Socioeconomic History   Marital status: Divorced     Spouse name: Not on file   Number of children: Not on file   Years of education: Not on file   Highest education level: Not on file  Occupational History   Not on file  Tobacco Use   Smoking status: Every Day    Current packs/day: 0.25    Average packs/day: 0.3 packs/day for 40.0 years (10.0 ttl pk-yrs)    Types: Cigarettes   Smokeless tobacco: Never  Vaping Use   Vaping status: Never Used  Substance and Sexual Activity   Alcohol use: Yes    Alcohol/week: 1.0 standard drink of alcohol    Types: 1 Cans of beer per week   Drug use: No   Sexual activity: Not Currently  Other Topics Concern   Not on file  Social History Narrative   Not on file   Social Drivers of Health   Financial Resource Strain: Not on file  Food Insecurity: Low Risk  (02/18/2023)   Received from [facility]   Hunger Vital Sign    Worried About Running Out of Food in the Last Year: Never true    Ran Out of Food in the Last Year: Never true  Transportation Needs: No Transportation Needs (02/18/2023)   Received from [facility]    In the past 12 months, has lack of reliable transportation kept  you from medical appointments, meetings, work or from getting things needed for daily living? : No  Physical Activity: Not on file  Stress: Not on file  Social Connections: Not on file  Intimate Partner Violence: Not on file    Family History  Problem Relation Age of Onset   Alzheimer's disease Mother    Heart attack Father     Current Outpatient Medications  Medication Sig Dispense Refill   acetaminophen (TYLENOL) 325 MG tablet Take 650 mg by mouth every 6 (six) hours as needed for moderate pain.     aspirin EC 81 MG tablet Take 81 mg by mouth daily.     atorvastatin (LIPITOR) 40 MG tablet Take 40 mg by mouth daily.     docusate sodium (COLACE) 100 MG capsule Take 1 capsule (100 mg total) by mouth 2 (two) times daily. 60 capsule 1   HYDROcodone-acetaminophen (NORCO/VICODIN) 5-325 MG  tablet Take 1 tablet by mouth every 8 (eight) hours as needed. 12 tablet 0   ibuprofen (ADVIL) 800 MG tablet Take 800 mg by mouth 3 (three) times daily as needed.     Menthol, Topical Analgesic, (ICY HOT EX) Apply 1 application topically daily as needed (pain).     metoprolol succinate (TOPROL-XL) 25 MG 24 hr tablet TAKE 1 TABLET BY MOUTH ONCE DAILY. 90 tablet 3   nitroGLYCERIN (NITROSTAT) 0.4 MG SL tablet Place 0.4 mg under the tongue every 5 (five) minutes x 3 doses as needed (if no relief after 3rd dose, proceed to ED or call 911).     Omega-3 Fatty Acids (FISH OIL) 500 MG CAPS Take 500 mg by mouth daily.     valsartan (DIOVAN) 40 MG tablet TAKE 1 TABLET BY MOUTH ONCE A DAY. 90 tablet 3   No current facility-administered medications for this visit.    No Known Allergies   REVIEW OF SYSTEMS:   [X]  denotes positive finding, [ ]  denotes negative finding Cardiac  Comments:  Chest pain or chest pressure:    Shortness of breath upon exertion:    Short of breath when lying flat:    Irregular heart rhythm:        Vascular    Pain in calf, thigh, or hip brought on by ambulation:    Pain in feet at night that wakes you up from your sleep:     Blood clot in your veins:    Leg swelling:         Pulmonary    Oxygen at home:    Productive cough:     Wheezing:         Neurologic    Sudden weakness in arms or legs:     Sudden numbness in arms or legs:     Sudden onset of difficulty speaking or slurred speech:    Temporary loss of vision in one eye:     Problems with dizziness:         Gastrointestinal    Blood in stool:     Vomited blood:         Genitourinary    Burning when urinating:     Blood in urine:        Psychiatric    Major depression:         Hematologic    Bleeding problems:    Problems with blood clotting too easily:        Skin    Rashes or ulcers:        Constitutional  Fever or chills:      PHYSICAL EXAMINATION:  Vitals:   05/20/23 0845  BP: (!)  153/92  Pulse: 73  Resp: 18  Temp: 97.7 F (36.5 C)  Weight: 176 lb (79.8 kg)  Height: 5\' 9"  (1.753 m)    General:  WDWN in NAD; vital signs documented above Gait: Not observed HENT: WNL, normocephalic Pulmonary: normal non-labored breathing , without Rales, rhonchi,  wheezing Cardiac: regular HR Abdomen: soft, NT, no masses Skin: without rashes Vascular Exam/Pulses: Palpable right DP pulse; palpable left PT pulse Extremities: without ischemic changes, without Gangrene , without cellulitis; without open wounds;  Musculoskeletal: no muscle wasting or atrophy  Neurologic: A&O X 3 Psychiatric:  The pt has Normal affect.   Non-Invasive Vascular Imaging:   AAA duplex demonstrates 5.3cm AAA at largest diameter which is increased from 4.4 cm 1 year ago    ASSESSMENT/PLAN:: 78 y.o. male here for follow up for surveillance of abdominal aortic aneurysm  Mr. Spurgeon Gancarz is a 78 year old male who returns office for surveillance of abdominal aortic aneurysm.  1 year ago AAA measured 4.4 cm at its largest diameter.  This has increased based on duplex.  Today AAA measures 5.3 cm in largest diameter which is roughly a centimeter of growth over the past year.  Based on growth rate and size of AAA currently he will be scheduled for a CTA abdomen and pelvis in the next couple weeks and will follow-up with Dr. Myra Gianotti in [place] to discuss results and possible repair.  He will continue his aspirin and statin daily.  Strongly encouraged smoking cessation.    Emilie Rutter, PA-C [facility] [phone]  Clinic MD:   Randie Heinz on call

## 2023-05-26 ENCOUNTER — Other Ambulatory Visit: Payer: Self-pay

## 2023-05-26 DIAGNOSIS — I714 Abdominal aortic aneurysm, without rupture, unspecified: Secondary | ICD-10-CM

## 2023-06-02 ENCOUNTER — Ambulatory Visit (HOSPITAL_COMMUNITY)
Admission: RE | Admit: 2023-06-02 | Discharge: 2023-06-02 | Disposition: A | Discharge status: Home / Self Care | Payer: HMO | Source: Ambulatory Visit | Attending: Surgery | Admitting: Surgery

## 2023-06-02 DIAGNOSIS — I714 Abdominal aortic aneurysm, without rupture, unspecified: Secondary | ICD-10-CM | POA: Insufficient documentation

## 2023-06-02 DIAGNOSIS — I70203 Unspecified atherosclerosis of native arteries of extremities, bilateral legs: Secondary | ICD-10-CM | POA: Diagnosis not present

## 2023-06-02 DIAGNOSIS — I7143 Infrarenal abdominal aortic aneurysm, without rupture: Secondary | ICD-10-CM | POA: Diagnosis not present

## 2023-06-02 DIAGNOSIS — I701 Atherosclerosis of renal artery: Secondary | ICD-10-CM | POA: Diagnosis not present

## 2023-06-02 DIAGNOSIS — N281 Cyst of kidney, acquired: Secondary | ICD-10-CM | POA: Diagnosis not present

## 2023-06-02 LAB — POCT I-STAT CREATININE: Creatinine, Ser: 1.1 mg/dL (ref 0.61–1.24)

## 2023-06-02 MED ORDER — IOHEXOL 350 MG/ML SOLN
75.0000 mL | Freq: Once | INTRAVENOUS | Status: AC | PRN
  Administered 2023-06-02: 75 mL via INTRAVENOUS

## 2023-06-16 ENCOUNTER — Encounter: Payer: Self-pay | Admitting: Surgery

## 2023-06-16 ENCOUNTER — Other Ambulatory Visit: Payer: Self-pay

## 2023-06-16 ENCOUNTER — Ambulatory Visit: Payer: HMO | Admitting: Surgery

## 2023-06-16 VITALS — BP 120/64 | HR 70 | Temp 98.6°F | Resp 20 | Ht 69.0 in | Wt 178.9 lb

## 2023-06-16 DIAGNOSIS — I714 Abdominal aortic aneurysm, without rupture, unspecified: Secondary | ICD-10-CM

## 2023-06-16 DIAGNOSIS — I7143 Infrarenal abdominal aortic aneurysm, without rupture: Secondary | ICD-10-CM

## 2023-06-16 NOTE — Progress Notes (Signed)
 [facility]  Patient name: [name] MRN: [id number] DOB: April 16, 1946 Sex: male   REASON FOR VISIT:    AAA follow up  HISOTRY OF PRESENT ILLNESS:    [name] is a 78 y.o. male who returns today for follow-up of his abdominal aortic aneurysm.  The patient is a current smoker.  He suffers from coronary artery disease, status post NSTEMI in 2010 treated with PCI.  He has a normal ejection fraction.  He takes a statin for hypercholesterolemia.  He is medically managed for hypertension with an ARB.   PAST MEDICAL HISTORY:   Past Medical History:  Diagnosis Date   CAD, NATIVE VESSEL 12/29/2008   Qualifier: Diagnosis of  By: Clifton James, MD, Christopher     CHEST PAIN-UNSPECIFIED 12/24/2008   Qualifier: Diagnosis of  By: Denyse Amass CMA, Carol     Coronary artery disease    Diabetes mellitus without complication (HCC)    DYSLIPIDEMIA 12/24/2008   Qualifier: Diagnosis of  By: Denyse Amass, CMA, Carol     Hyperlipidemia    Hypertension    MI (myocardial infarction) (HCC)    TOBACCO USER 12/24/2008   Qualifier: Diagnosis of  By: Denyse Amass, CMA, Carol       FAMILY HISTORY:   Family History  Problem Relation Age of Onset   Alzheimer's disease Mother    Heart attack Father     SOCIAL HISTORY:   Social History   Tobacco Use   Smoking status: Every Day    Current packs/day: 0.25    Average packs/day: 0.3 packs/day for 40.0 years (10.0 ttl pk-yrs)    Types: Cigarettes   Smokeless tobacco: Never  Substance Use Topics   Alcohol use: Yes    Alcohol/week: 1.0 standard drink of alcohol    Types: 1 Cans of beer per week     ALLERGIES:   No Known Allergies   CURRENT MEDICATIONS:   Current Outpatient Medications  Medication Sig Dispense Refill   acetaminophen (TYLENOL) 325 MG tablet Take 650 mg by mouth every 6 (six) hours as needed for moderate pain.     aspirin EC 81 MG tablet Take 81 mg by mouth daily.     atorvastatin  (LIPITOR) 40 MG tablet Take 40 mg by mouth daily.     docusate sodium (COLACE) 100 MG capsule Take 1 capsule (100 mg total) by mouth 2 (two) times daily. 60 capsule 1   ezetimibe (ZETIA) 10 MG tablet Take 10 mg by mouth daily.     HYDROcodone-acetaminophen (NORCO/VICODIN) 5-325 MG tablet Take 1 tablet by mouth every 8 (eight) hours as needed. 12 tablet 0   ibuprofen (ADVIL) 800 MG tablet Take 800 mg by mouth 3 (three) times daily as needed.     Menthol, Topical Analgesic, (ICY HOT EX) Apply 1 application topically daily as needed (pain).     metoprolol succinate (TOPROL-XL) 25 MG 24 hr tablet TAKE 1 TABLET BY MOUTH ONCE DAILY. 90 tablet 3   nitroGLYCERIN (NITROSTAT) 0.4 MG SL tablet Place 0.4 mg under the tongue every 5 (five) minutes x 3 doses as needed (if no relief after 3rd dose, proceed to ED or call 911).     Omega-3 Fatty Acids (FISH OIL) 500 MG CAPS Take 500 mg by mouth daily.     valsartan (DIOVAN) 40 MG tablet TAKE 1 TABLET BY MOUTH ONCE A DAY. 90 tablet 3   telmisartan (MICARDIS) 40 MG tablet Take 40 mg by mouth daily. (Patient not taking:  Reported on 06/16/2023)     No current facility-administered medications for this visit.    REVIEW OF SYSTEMS:   [X]  denotes positive finding, [ ]  denotes negative finding Cardiac  Comments:  Chest pain or chest pressure:    Shortness of breath upon exertion:    Short of breath when lying flat:    Irregular heart rhythm:        Vascular    Pain in calf, thigh, or hip brought on by ambulation:    Pain in feet at night that wakes you up from your sleep:     Blood clot in your veins:    Leg swelling:         Pulmonary    Oxygen at home:    Productive cough:     Wheezing:         Neurologic    Sudden weakness in arms or legs:     Sudden numbness in arms or legs:     Sudden onset of difficulty speaking or slurred speech:    Temporary loss of vision in one eye:     Problems with dizziness:         Gastrointestinal    Blood in stool:      Vomited blood:         Genitourinary    Burning when urinating:     Blood in urine:        Psychiatric    Major depression:         Hematologic    Bleeding problems:    Problems with blood clotting too easily:        Skin    Rashes or ulcers:        Constitutional    Fever or chills:      PHYSICAL EXAM:   Vitals:   06/16/23 1054  BP: 120/64  Pulse: 70  Resp: 20  Temp: 98.6 F (37 C)  SpO2: 96%  Weight: 178 lb 14.4 oz (81.1 kg)  Height: 5\' 9"  (1.753 m)    GENERAL: The patient is a well-nourished male, in no acute distress. The vital signs are documented above. CARDIAC: There is a regular rate and rhythm.  VASCULAR: Nonpalpable pedal pulses PULMONARY: Non-labored respirations ABDOMEN: Soft and non-tender with normal pitched bowel sounds.  MUSCULOSKELETAL: There are no major deformities or cyanosis. NEUROLOGIC: No focal weakness or paresthesias are detected. SKIN: There are no ulcers or rashes noted. PSYCHIATRIC: The patient has a normal affect.  STUDIES:   I have reviewed the following CTA:  1. Infrarenal abdominal aortic aneurysm measuring up to 5.3 cm. Abdominal aortic aneurysm measured 4.1 cm in 2020. 2. Diffuse atherosclerotic disease in the aorta and iliac arteries. Mild narrowing near the origin of the left common iliac artery. Tortuosity in bilateral external iliac arteries. 3. Single bilateral renal arteries. Moderate stenosis near the origin of the right renal artery.   NON-VASCULAR   1. No acute abnormality in the abdomen or pelvis.   MEDICAL ISSUES:   AAA: Maximum diameter is greater than 5.3 cm.  He is a good candidate for endovascular repair which I have recommended.  We talked about the risk and benefit of surgery including the risk of cardiopulmonary complications, stroke, renal issues, intestinal complications and access issues.  All of his questions were answered today he would like to get this done in the near future.  I am sending  him to get carotid studies and ABI studies for baseline measurement.  Charlena Cross, MD, FACS [facility] [phone] Pager [phone]

## 2023-06-16 NOTE — H&P (View-Only) (Signed)
 [facility]  Patient name: [name] MRN: [id number] DOB: April 16, 1946 Sex: male   REASON FOR VISIT:    AAA follow up  HISOTRY OF PRESENT ILLNESS:    [name] is a 78 y.o. male who returns today for follow-up of his abdominal aortic aneurysm.  The patient is a current smoker.  He suffers from coronary artery disease, status post NSTEMI in 2010 treated with PCI.  He has a normal ejection fraction.  He takes a statin for hypercholesterolemia.  He is medically managed for hypertension with an ARB.   PAST MEDICAL HISTORY:   Past Medical History:  Diagnosis Date   CAD, NATIVE VESSEL 12/29/2008   Qualifier: Diagnosis of  By: Clifton James, MD, Christopher     CHEST PAIN-UNSPECIFIED 12/24/2008   Qualifier: Diagnosis of  By: Denyse Amass CMA, Carol     Coronary artery disease    Diabetes mellitus without complication (HCC)    DYSLIPIDEMIA 12/24/2008   Qualifier: Diagnosis of  By: Denyse Amass, CMA, Carol     Hyperlipidemia    Hypertension    MI (myocardial infarction) (HCC)    TOBACCO USER 12/24/2008   Qualifier: Diagnosis of  By: Denyse Amass, CMA, Carol       FAMILY HISTORY:   Family History  Problem Relation Age of Onset   Alzheimer's disease Mother    Heart attack Father     SOCIAL HISTORY:   Social History   Tobacco Use   Smoking status: Every Day    Current packs/day: 0.25    Average packs/day: 0.3 packs/day for 40.0 years (10.0 ttl pk-yrs)    Types: Cigarettes   Smokeless tobacco: Never  Substance Use Topics   Alcohol use: Yes    Alcohol/week: 1.0 standard drink of alcohol    Types: 1 Cans of beer per week     ALLERGIES:   No Known Allergies   CURRENT MEDICATIONS:   Current Outpatient Medications  Medication Sig Dispense Refill   acetaminophen (TYLENOL) 325 MG tablet Take 650 mg by mouth every 6 (six) hours as needed for moderate pain.     aspirin EC 81 MG tablet Take 81 mg by mouth daily.     atorvastatin  (LIPITOR) 40 MG tablet Take 40 mg by mouth daily.     docusate sodium (COLACE) 100 MG capsule Take 1 capsule (100 mg total) by mouth 2 (two) times daily. 60 capsule 1   ezetimibe (ZETIA) 10 MG tablet Take 10 mg by mouth daily.     HYDROcodone-acetaminophen (NORCO/VICODIN) 5-325 MG tablet Take 1 tablet by mouth every 8 (eight) hours as needed. 12 tablet 0   ibuprofen (ADVIL) 800 MG tablet Take 800 mg by mouth 3 (three) times daily as needed.     Menthol, Topical Analgesic, (ICY HOT EX) Apply 1 application topically daily as needed (pain).     metoprolol succinate (TOPROL-XL) 25 MG 24 hr tablet TAKE 1 TABLET BY MOUTH ONCE DAILY. 90 tablet 3   nitroGLYCERIN (NITROSTAT) 0.4 MG SL tablet Place 0.4 mg under the tongue every 5 (five) minutes x 3 doses as needed (if no relief after 3rd dose, proceed to ED or call 911).     Omega-3 Fatty Acids (FISH OIL) 500 MG CAPS Take 500 mg by mouth daily.     valsartan (DIOVAN) 40 MG tablet TAKE 1 TABLET BY MOUTH ONCE A DAY. 90 tablet 3   telmisartan (MICARDIS) 40 MG tablet Take 40 mg by mouth daily. (Patient not taking:  Reported on 06/16/2023)     No current facility-administered medications for this visit.    REVIEW OF SYSTEMS:   [X]  denotes positive finding, [ ]  denotes negative finding Cardiac  Comments:  Chest pain or chest pressure:    Shortness of breath upon exertion:    Short of breath when lying flat:    Irregular heart rhythm:        Vascular    Pain in calf, thigh, or hip brought on by ambulation:    Pain in feet at night that wakes you up from your sleep:     Blood clot in your veins:    Leg swelling:         Pulmonary    Oxygen at home:    Productive cough:     Wheezing:         Neurologic    Sudden weakness in arms or legs:     Sudden numbness in arms or legs:     Sudden onset of difficulty speaking or slurred speech:    Temporary loss of vision in one eye:     Problems with dizziness:         Gastrointestinal    Blood in stool:      Vomited blood:         Genitourinary    Burning when urinating:     Blood in urine:        Psychiatric    Major depression:         Hematologic    Bleeding problems:    Problems with blood clotting too easily:        Skin    Rashes or ulcers:        Constitutional    Fever or chills:      PHYSICAL EXAM:   Vitals:   06/16/23 1054  BP: 120/64  Pulse: 70  Resp: 20  Temp: 98.6 F (37 C)  SpO2: 96%  Weight: 178 lb 14.4 oz (81.1 kg)  Height: 5\' 9"  (1.753 m)    GENERAL: The patient is a well-nourished male, in no acute distress. The vital signs are documented above. CARDIAC: There is a regular rate and rhythm.  VASCULAR: Nonpalpable pedal pulses PULMONARY: Non-labored respirations ABDOMEN: Soft and non-tender with normal pitched bowel sounds.  MUSCULOSKELETAL: There are no major deformities or cyanosis. NEUROLOGIC: No focal weakness or paresthesias are detected. SKIN: There are no ulcers or rashes noted. PSYCHIATRIC: The patient has a normal affect.  STUDIES:   I have reviewed the following CTA:  1. Infrarenal abdominal aortic aneurysm measuring up to 5.3 cm. Abdominal aortic aneurysm measured 4.1 cm in 2020. 2. Diffuse atherosclerotic disease in the aorta and iliac arteries. Mild narrowing near the origin of the left common iliac artery. Tortuosity in bilateral external iliac arteries. 3. Single bilateral renal arteries. Moderate stenosis near the origin of the right renal artery.   NON-VASCULAR   1. No acute abnormality in the abdomen or pelvis.   MEDICAL ISSUES:   AAA: Maximum diameter is greater than 5.3 cm.  He is a good candidate for endovascular repair which I have recommended.  We talked about the risk and benefit of surgery including the risk of cardiopulmonary complications, stroke, renal issues, intestinal complications and access issues.  All of his questions were answered today he would like to get this done in the near future.  I am sending  him to get carotid studies and ABI studies for baseline measurement.  Charlena Cross, MD, FACS [facility] [phone] Pager [phone]

## 2023-06-23 ENCOUNTER — Other Ambulatory Visit: Payer: Self-pay

## 2023-06-23 DIAGNOSIS — I6529 Occlusion and stenosis of unspecified carotid artery: Secondary | ICD-10-CM

## 2023-06-23 DIAGNOSIS — I70213 Atherosclerosis of native arteries of extremities with intermittent claudication, bilateral legs: Secondary | ICD-10-CM

## 2023-06-24 ENCOUNTER — Ambulatory Visit (INDEPENDENT_AMBULATORY_CARE_PROVIDER_SITE_OTHER): Payer: HMO

## 2023-06-24 DIAGNOSIS — I6529 Occlusion and stenosis of unspecified carotid artery: Secondary | ICD-10-CM

## 2023-06-24 DIAGNOSIS — I70213 Atherosclerosis of native arteries of extremities with intermittent claudication, bilateral legs: Secondary | ICD-10-CM

## 2023-06-24 LAB — VAS US ABI WITH/WO TBI
Left ABI: 0.14
Right ABI: 1.06

## 2023-06-25 NOTE — Progress Notes (Signed)
 Surgical Instructions   Your procedure is scheduled on Wednesday, March 12th, 2025. Report to [facility] Main Entrance "A" at 6:30 A.M., then check in with the Admitting office. Any questions or running late day of surgery: call [phone]  Questions prior to your surgery date: call [phone], Monday-Friday, 8am-4pm. If you experience any cold or flu symptoms such as cough, fever, chills, shortness of breath, etc. between now and your scheduled surgery, please notify us at the above number.     Remember:  Do not eat or drink after midnight the night before your surgery   Take these medicines the morning of surgery with A SIP OF WATER: Atorvastatin (Lipitor) Docusate Sodium (Colace) Ezetimibe (Zetia) Metoprolol Succinate (Toprol-XL)   May take these medicines IF NEEDED: Acetaminophen (Tylenol) Hydrocodone-acetaminophen (Norco/Vicodin) Nitroglycerin (Nitrostat)   Please follow your surgeon's instructions for Aspirin.  If no instructions received, please reach out to your surgeon's office.     One week prior to surgery, STOP taking  Aleve, Naproxen, Ibuprofen, Motrin, Advil, Goody's, BC's, all herbal medications, fish oil, and non-prescription vitamins.                     Do NOT Smoke (Tobacco/Vaping) for 24 hours prior to your procedure.  If you use a CPAP at night, you may bring your mask/headgear for your overnight stay.   You will be asked to remove any contacts, glasses, piercing's, hearing aid's, dentures/partials prior to surgery. Please bring cases for these items if needed.    Patients discharged the day of surgery will not be allowed to drive home, and someone needs to stay with them for 24 hours.  SURGICAL WAITING ROOM VISITATION Patients may have no more than 2 support people in the waiting area - these visitors may rotate.   Pre-op nurse will coordinate an appropriate time for 1 ADULT support person, who may not rotate, to accompany patient in pre-op.   Children under the age of 75 must have an adult with them who is not the patient and must remain in the main waiting area with an adult.  If the patient needs to stay at the hospital during part of their recovery, the visitor guidelines for inpatient rooms apply.  Please refer to the [facility] website for the visitor guidelines for any additional information.   If you received a COVID test during your pre-op visit  it is requested that you wear a mask when out in public, stay away from anyone that may not be feeling well and notify your surgeon if you develop symptoms. If you have been in contact with anyone that has tested positive in the last 10 days please notify you surgeon.      Pre-operative CHG Bathing Instructions   You can play a key role in reducing the risk of infection after surgery. Your skin needs to be as free of germs as possible. You can reduce the number of germs on your skin by washing with CHG (chlorhexidine gluconate) soap before surgery. CHG is an antiseptic soap that kills germs and continues to kill germs even after washing.   DO NOT use if you have an allergy to chlorhexidine/CHG or antibacterial soaps. If your skin becomes reddened or irritated, stop using the CHG and notify one of our RNs at [phone].              TAKE A SHOWER THE NIGHT BEFORE SURGERY AND THE DAY OF SURGERY    Please keep in mind  the following:  DO NOT shave, including legs and underarms, 48 hours prior to surgery.   You may shave your face before/day of surgery.  Place clean sheets on your bed the night before surgery Use a clean washcloth (not used since being washed) for each shower. DO NOT sleep with pet's night before surgery.  CHG Shower Instructions:  Wash your face and private area with normal soap. If you choose to wash your hair, wash first with your normal shampoo.  After you use shampoo/soap, rinse your hair and body thoroughly to remove shampoo/soap residue.  Turn the  water OFF and apply half the bottle of CHG soap to a CLEAN washcloth.  Apply CHG soap ONLY FROM YOUR NECK DOWN TO YOUR TOES (washing for 3-5 minutes)  DO NOT use CHG soap on face, private areas, open wounds, or sores.  Pay special attention to the area where your surgery is being performed.  If you are having back surgery, having someone wash your back for you may be helpful. Wait 2 minutes after CHG soap is applied, then you may rinse off the CHG soap.  Pat dry with a clean towel  Put on clean pajamas    Additional instructions for the day of surgery: DO NOT APPLY any lotions, deodorants, cologne, or perfumes.   Do not wear jewelry or makeup Do not wear nail polish, gel polish, artificial nails, or any other type of covering on natural nails (fingers and toes) Do not bring valuables to the hospital. [facility] is not responsible for valuables/personal belongings. Put on clean/comfortable clothes.  Please brush your teeth.  Ask your nurse before applying any prescription medications to the skin.

## 2023-06-26 ENCOUNTER — Other Ambulatory Visit: Payer: Self-pay

## 2023-06-26 ENCOUNTER — Encounter (HOSPITAL_COMMUNITY)
Admission: RE | Admit: 2023-06-26 | Discharge: 2023-06-26 | Disposition: A | Discharge status: Home / Self Care | Payer: HMO | Source: Ambulatory Visit | Attending: Surgery | Admitting: Surgery

## 2023-06-26 ENCOUNTER — Encounter (HOSPITAL_COMMUNITY): Payer: Self-pay

## 2023-06-26 ENCOUNTER — Encounter: Admitting: Cardiovascular Disease

## 2023-06-26 ENCOUNTER — Encounter: Payer: Self-pay | Admitting: Cardiovascular Disease

## 2023-06-26 VITALS — BP 118/80 | HR 77 | Ht 69.0 in | Wt 178.6 lb

## 2023-06-26 VITALS — BP 127/79 | HR 70 | Temp 97.8°F | Resp 18 | Ht 69.0 in | Wt 178.8 lb

## 2023-06-26 DIAGNOSIS — F17210 Nicotine dependence, cigarettes, uncomplicated: Secondary | ICD-10-CM | POA: Diagnosis not present

## 2023-06-26 DIAGNOSIS — I2510 Atherosclerotic heart disease of native coronary artery without angina pectoris: Secondary | ICD-10-CM | POA: Insufficient documentation

## 2023-06-26 DIAGNOSIS — Z01818 Encounter for other preprocedural examination: Secondary | ICD-10-CM | POA: Insufficient documentation

## 2023-06-26 DIAGNOSIS — I10 Essential (primary) hypertension: Secondary | ICD-10-CM | POA: Diagnosis not present

## 2023-06-26 DIAGNOSIS — I7143 Infrarenal abdominal aortic aneurysm, without rupture: Secondary | ICD-10-CM | POA: Insufficient documentation

## 2023-06-26 DIAGNOSIS — E785 Hyperlipidemia, unspecified: Secondary | ICD-10-CM | POA: Diagnosis not present

## 2023-06-26 DIAGNOSIS — E119 Type 2 diabetes mellitus without complications: Secondary | ICD-10-CM | POA: Diagnosis not present

## 2023-06-26 DIAGNOSIS — Z720 Tobacco use: Secondary | ICD-10-CM | POA: Diagnosis not present

## 2023-06-26 DIAGNOSIS — Z01810 Encounter for preprocedural cardiovascular examination: Secondary | ICD-10-CM

## 2023-06-26 DIAGNOSIS — Z955 Presence of coronary angioplasty implant and graft: Secondary | ICD-10-CM | POA: Insufficient documentation

## 2023-06-26 DIAGNOSIS — I714 Abdominal aortic aneurysm, without rupture, unspecified: Secondary | ICD-10-CM | POA: Diagnosis not present

## 2023-06-26 DIAGNOSIS — I252 Old myocardial infarction: Secondary | ICD-10-CM | POA: Insufficient documentation

## 2023-06-26 DIAGNOSIS — I4510 Unspecified right bundle-branch block: Secondary | ICD-10-CM | POA: Insufficient documentation

## 2023-06-26 DIAGNOSIS — Z01812 Encounter for preprocedural laboratory examination: Secondary | ICD-10-CM | POA: Diagnosis present

## 2023-06-26 HISTORY — DX: Unspecified osteoarthritis, unspecified site: M19.90

## 2023-06-26 LAB — TYPE AND SCREEN
ABO/RH(D): O POS
Antibody Screen: NEGATIVE

## 2023-06-26 LAB — URINALYSIS, ROUTINE W REFLEX MICROSCOPIC
Bilirubin Urine: NEGATIVE
Glucose, UA: NEGATIVE mg/dL
Hgb urine dipstick: NEGATIVE
Ketones, ur: NEGATIVE mg/dL
Leukocytes,Ua: NEGATIVE
Nitrite: NEGATIVE
Protein, ur: NEGATIVE mg/dL
Specific Gravity, Urine: 1.018 (ref 1.005–1.030)
pH: 5 (ref 5.0–8.0)

## 2023-06-26 LAB — COMPREHENSIVE METABOLIC PANEL
ALT: 23 U/L (ref 0–44)
AST: 18 U/L (ref 15–41)
Albumin: 3.7 g/dL (ref 3.5–5.0)
Alkaline Phosphatase: 45 U/L (ref 38–126)
Anion gap: 7 (ref 5–15)
BUN: 15 mg/dL (ref 8–23)
CO2: 29 mmol/L (ref 22–32)
Calcium: 9.4 mg/dL (ref 8.9–10.3)
Chloride: 105 mmol/L (ref 98–111)
Creatinine, Ser: 1.12 mg/dL (ref 0.61–1.24)
GFR, Estimated: >60 mL/min (ref >60)
Glucose, Bld: 88 mg/dL (ref 70–99)
Potassium: 4.9 mmol/L (ref 3.5–5.1)
Sodium: 141 mmol/L (ref 135–145)
Total Bilirubin: 0.9 mg/dL (ref 0.0–1.2)
Total Protein: 6.6 g/dL (ref 6.5–8.1)

## 2023-06-26 LAB — CBC
HCT: 45.4 % (ref 39.0–52.0)
Hemoglobin: 14.9 g/dL (ref 13.0–17.0)
MCH: 31.6 pg (ref 26.0–34.0)
MCHC: 32.8 g/dL (ref 30.0–36.0)
MCV: 96.2 fL (ref 80.0–100.0)
Platelets: 204 10*3/uL (ref 150–400)
RBC: 4.72 MIL/uL (ref 4.22–5.81)
RDW: 12.8 % (ref 11.5–15.5)
WBC: 9.5 10*3/uL (ref 4.0–10.5)
nRBC: 0 % (ref 0.0–0.2)

## 2023-06-26 LAB — SURGICAL PCR SCREEN
MRSA, PCR: NEGATIVE
Staphylococcus aureus: NEGATIVE

## 2023-06-26 LAB — APTT: aPTT: 28 s (ref 24–36)

## 2023-06-26 LAB — PROTIME-INR
INR: 1.1 (ref 0.8–1.2)
Prothrombin Time: 14.1 s (ref 11.4–15.2)

## 2023-06-26 NOTE — Progress Notes (Signed)
 PCP - Nita Sells, MD Cardiologist -Clifton James Avel Peace, MD  PPM/ICD - denies Device Orders - n/a Rep Notified - n/a  Chest x-ray - denies EKG - 12/31/2022 Stress Test - 11/19/2016 ECHO - 09/21/2021 Cardiac Cath - per pt in 2024; no records found.   Sleep Study - denies  CPAP - n/a  Fasting Blood Sugar - no DM Checks Blood Sugar _____ times a day  Last dose of GLP1 agonist-  n/a GLP1 instructions: n/a  Blood Thinner Instructions: n/a Aspirin Instructions: follow surgeon's instructions for Aspirin. If no instructions received, reach out to the surgeon's office.   ERAS Protcol - no, NPO PRE-SURGERY Ensure or G2- no  COVID TEST- n/a   Anesthesia review: yes; hx of cardiac studies. Pt last seen by his cardiologist on 12/31/2022. His next  appt is on 03/14, sx is scheduled on 03/12.   Pt was encouraged to call his cardiology office to reschedule his upcoming appt to bee seen before his surgery.  Patient denies shortness of breath, fever, cough and chest pain at PAT appointment   All instructions explained to the patient, with a verbal understanding of the material. Patient agrees to go over the instructions while at home for a better understanding. Patient also instructed to self quarantine after being tested for COVID-19. The opportunity to ask questions was provided.

## 2023-06-26 NOTE — Progress Notes (Signed)
 Chief Complaint  Patient presents with   Follow-up    CAD, pre-operative cardiac examination   History of Present Illness: 78 yo male with history of CAD, DM, HLD, HTN, AAA, RBBB and tobacco abuse who is here for follow up. He had a NSTEMI in August of 2010 and was found to have a severe stenosis in the mid Circumflex artery treated with a large bare metal stent. LV function was normal in 2011. Nuclear stress test in July 2018 with no ischemia. Echo June 2023 with LVEF=60-65%. Normal RV function. Trivial mitral valve regurgitation. Aortic valve sclerosis without stenosis. His AAA has been followed in the Vascular surgery office by Dr. Myra Gianotti and has recently increased to 5.3 cm. Planning is underway for endovascular stent graft on 07/02/23 with Dr. Myra Gianotti.   He is here today for follow up. The patient denies any chest pain, dyspnea, palpitations, lower extremity edema, orthopnea, PND, dizziness, near syncope or syncope. He continues to smoke.    Primary Care Physician: Benita Stabile, MD  Past Medical History:  Diagnosis Date   Arthritis    CAD, NATIVE VESSEL 12/29/2008   Qualifier: Diagnosis of  By: Clifton James, MD, Rhen Kawecki     CHEST PAIN-UNSPECIFIED 12/24/2008   Qualifier: Diagnosis of  By: Denyse Amass CMA, Carol     Coronary artery disease    DYSLIPIDEMIA 12/24/2008   Qualifier: Diagnosis of  By: Denyse Amass CMA, Carol     Hyperlipidemia    Hypertension    MI (myocardial infarction) (HCC)    TOBACCO USER 12/24/2008   Qualifier: Diagnosis of  By: Wendee Copp      Past Surgical History:  Procedure Laterality Date   CATARACT EXTRACTION Bilateral    COLONOSCOPY N/A 01/12/2014   Procedure: COLONOSCOPY;  Surgeon: Malissa Hippo, MD;  Location: [facility] ENDO SUITE;  Service: Endoscopy;  Laterality: N/A;  1030   CORONARY ANGIOPLASTY     HEMORROIDECTOMY     INGUINAL HERNIA REPAIR Right 05/01/2018   Procedure: RIGHT INGUINAL HERNIA REPAIR WITH MESH;  Surgeon: Lucretia Roers, MD;  Location:  [facility] ORS;  Service: General;  Laterality: Right;   right middle finger     reattached from accident    Current Outpatient Medications  Medication Sig Dispense Refill   acetaminophen (TYLENOL) 650 MG CR tablet Take 650-1,300 mg by mouth every 8 (eight) hours as needed for pain.     aspirin EC 81 MG tablet Take 81 mg by mouth daily.     atorvastatin (LIPITOR) 40 MG tablet Take 40 mg by mouth in the morning.     docusate sodium (COLACE) 100 MG capsule Take 1 capsule (100 mg total) by mouth 2 (two) times daily. (Patient taking differently: Take 100 mg by mouth in the morning.) 60 capsule 1   ezetimibe (ZETIA) 10 MG tablet Take 10 mg by mouth in the morning.     HYDROcodone-acetaminophen (NORCO/VICODIN) 5-325 MG tablet Take 1 tablet by mouth every 8 (eight) hours as needed. 12 tablet 0   ibuprofen (ADVIL) 200 MG tablet Take 600 mg by mouth every 8 (eight) hours as needed (pain.).     ibuprofen (ADVIL) 800 MG tablet Take 800 mg by mouth 3 (three) times daily as needed.     Menthol, Topical Analgesic, (ICY HOT EX) Apply 1 application topically daily as needed (pain).     metoprolol succinate (TOPROL-XL) 25 MG 24 hr tablet TAKE 1 TABLET BY MOUTH ONCE DAILY. 90 tablet 3   nitroGLYCERIN (NITROSTAT) 0.4 MG  SL tablet Place 0.4 mg under the tongue every 5 (five) minutes x 3 doses as needed (if no relief after 3rd dose, proceed to ED or call 911).     Omega-3 Fatty Acids (FISH OIL PO) Take 1,200 mg by mouth daily.     telmisartan (MICARDIS) 40 MG tablet Take 20 mg by mouth in the morning.     No current facility-administered medications for this visit.    No Known Allergies  Social History   Socioeconomic History   Marital status: Divorced    Spouse name: Not on file   Number of children: Not on file   Years of education: Not on file   Highest education level: Not on file  Occupational History   Not on file  Tobacco Use   Smoking status: Every Day    Current packs/day: 0.25    Average  packs/day: 0.3 packs/day for 40.0 years (10.0 ttl pk-yrs)    Types: Cigarettes   Smokeless tobacco: Never  Vaping Use   Vaping status: Never Used  Substance and Sexual Activity   Alcohol use: Yes    Alcohol/week: 1.0 standard drink of alcohol    Types: 1 Cans of beer per week   Drug use: No   Sexual activity: Not Currently  Other Topics Concern   Not on file  Social History Narrative   Not on file   Social Drivers of Health   Financial Resource Strain: Not on file  Food Insecurity: Low Risk  (02/18/2023)   Received from [facility]   Hunger Vital Sign    Worried About Running Out of Food in the Last Year: Never true    Ran Out of Food in the Last Year: Never true  Transportation Needs: No Transportation Needs (02/18/2023)   Received from [facility]    In the past 12 months, has lack of reliable transportation kept you from medical appointments, meetings, work or from getting things needed for daily living? : No  Physical Activity: Not on file  Stress: Not on file  Social Connections: Not on file  Intimate Partner Violence: Not on file    Family History  Problem Relation Age of Onset   Alzheimer's disease Mother    Heart attack Father     Review of Systems:  As stated in the HPI and otherwise negative.   BP 118/80   Pulse 77   Ht 5\' 9"  (1.753 m)   Wt 81 kg   SpO2 99%   BMI 26.37 kg/m   Physical Examination: General: Well developed, well nourished, NAD  HEENT: OP clear, mucus membranes moist  SKIN: warm, dry. No rashes. Neuro: No focal deficits  Musculoskeletal: Muscle strength 5/5 all ext  Psychiatric: Mood and affect normal  Neck: No JVD, no carotid bruits, no thyromegaly, no lymphadenopathy.  Lungs:Clear bilaterally, no wheezes, rhonci, crackles Cardiovascular: Regular rate and rhythm. No murmurs, gallops or rubs. Abdomen:Soft. Bowel sounds present. Non-tender.  Extremities: No lower extremity edema. Pulses are 2 + in the bilateral  DP/PT.  EKG:  EKG is ordered today. The ekg ordered today demonstrates  EKG Interpretation Date/Time:  Thursday June 26 2023 09:38:56 EST Ventricular Rate:  70 PR Interval:  196 QRS Duration:  146 QT Interval:  424 QTC Calculation: 457 R Axis:   98  Text Interpretation: Normal sinus rhythm Rightward axis Non-specific intra-ventricular conduction block Confirmed by Verne Carrow [phone]) on 06/26/2023 9:58:30 AM   Recent Labs: 06/02/2023: Creatinine, Ser 1.10 06/26/2023: Hemoglobin  14.9; Platelets 204   Lipid Panel    Component Value Date/Time   CHOL 106 05/17/2009 0122   TRIG 111 05/17/2009 0122   HDL 26 (L) 05/17/2009 0122   CHOLHDL 4.1 Ratio 05/17/2009 0122   VLDL 22 05/17/2009 0122   LDLCALC 58 05/17/2009 0122     Wt Readings from Last 3 Encounters:  06/26/23 81 kg  06/26/23 81.1 kg  06/16/23 81.1 kg    Assessment and Plan:   1. CAD without angina: He is known to have CAD with prior stenting of the Circumflex artery in 2010. He has no chest pain suggestive of angina. Will continue ASA, statin, Zetia and beta blocker.     2. HTN: BP is well controlled. Continue Micardis and Toprol.   3. HLD: Lipids followed in primary care. Continue Lipitor and Zetia.   4. Tobacco abuse: Smoking cessation is advised.   5. AAA: planning for endovascular repair next week.   6. Pre-operative cardiovascular examination: He has no angina, CHF. He is very active. He has an upcoming endovascular procedure. EKG is unchanged today. He can proceed with has planned procedure without further cardiac workup.   Labs/ tests ordered today include:   Orders Placed This Encounter  Procedures   EKG 12-Lead   Disposition:   F/U with me in 12 months  Signed, Verne Carrow, MD 06/26/2023 10:10 AM    [facility] [address] [place], [place], [place]  [address] Phone: [phone]; Fax: [phone]

## 2023-06-26 NOTE — Patient Instructions (Signed)
Medication Instructions:  No changes *If you need a refill on your cardiac medications before your next appointment, please call your pharmacy*   Lab Work: none If you have labs (blood work) drawn today and your tests are completely normal, you will receive your results only by: [place] (if you have MyChart) OR A paper copy in the mail If you have any lab test that is abnormal or we need to change your treatment, we will call you to review the results.   Testing/Procedures: none   Follow-Up: At [facility], you and your health needs are our priority.  As part of our continuing mission to provide you with exceptional heart care, we have created designated Provider Care Teams.  These Care Teams include your primary Cardiologist (physician) and Advanced Practice Providers (APPs -  Physician Assistants and Nurse Practitioners) who all work together to provide you with the care you need, when you need it.  We recommend signing up for the patient portal called "MyChart".  Sign up information is provided on this After Visit Summary.  MyChart is used to connect with patients for Virtual Visits (Telemedicine).  Patients are able to view lab/test results, encounter notes, upcoming appointments, etc.  Non-urgent messages can be sent to your provider as well.   To learn more about what you can do with MyChart, go to [email or web address].    Your next appointment:   12 month(s)  Provider:   Lauree Chandler, MD     Other Instructions

## 2023-06-27 NOTE — Anesthesia Preprocedure Evaluation (Addendum)
 Anesthesia Evaluation  Patient identified by MRN, date of birth, ID band Patient awake    Reviewed: Allergy & Precautions, H&P , NPO status , Patient's Chart, lab work & pertinent test results  Airway Mallampati: II   Neck ROM: full    Dental   Pulmonary Current Smoker   breath sounds clear to auscultation       Cardiovascular hypertension, + CAD, + Past MI, + Cardiac Stents and + Peripheral Vascular Disease   Rhythm:regular Rate:Normal  Preserved LV function.  Infrarenal AAA.   Neuro/Psych    GI/Hepatic   Endo/Other    Renal/GU      Musculoskeletal  (+) Arthritis ,    Abdominal   Peds  Hematology   Anesthesia Other Findings   Reproductive/Obstetrics                             Anesthesia Physical Anesthesia Plan  ASA: 3  Anesthesia Plan: General   Post-op Pain Management:    Induction: Intravenous  PONV Risk Score and Plan: 1 and Ondansetron, Dexamethasone and Treatment may vary due to age or medical condition  Airway Management Planned: Oral ETT  Additional Equipment: Arterial line  Intra-op Plan:   Post-operative Plan: Extubation in OR  Informed Consent: I have reviewed the patients History and Physical, chart, labs and discussed the procedure including the risks, benefits and alternatives for the proposed anesthesia with the patient or authorized representative who has indicated his/her understanding and acceptance.     Dental advisory given  Plan Discussed with: CRNA, Anesthesiologist and Surgeon  Anesthesia Plan Comments: (PAT note by Antionette Poles, PA-C: 78 year old male follows with cardiology for history of CAD, DM, HLD, HTN, AAA, RBBB and current smoker. He had a NSTEMI in August of 2010 and was found to have a severe stenosis in the mid Circumflex artery treated with a large bare metal stent. LV function was normal in 2011. Nuclear stress test in July 2018 with no  ischemia. Echo June 2023 with LVEF=60-65%. Normal RV function. Trivial mitral valve regurgitation. Aortic valve sclerosis without stenosis. His AAA has been followed in the Vascular surgery office by Dr. Myra Gianotti and has recently increased to 5.3 cm.  Last seen by Dr. Clifton James on 06/26/2023.  Per note, " Pre-operative cardiovascular examination: He has no angina, CHF. He is very active. He has an upcoming endovascular procedure. EKG is unchanged today. He can proceed with has planned procedure without further cardiac workup."  Preop labs reviewed, WNL.  EKG 06/26/2023: NSR.  Rate 70.  Rightward axis.  Nonspecific intraventricular conduction block.  CTA abdomen pelvis 06/02/2023: IMPRESSION: VASCULAR  1. Infrarenal abdominal aortic aneurysm measuring up to 5.3 cm. Abdominal aortic aneurysm measured 4.1 cm in 2020. 2. Diffuse atherosclerotic disease in the aorta and iliac arteries. Mild narrowing near the origin of the left common iliac artery. Tortuosity in bilateral external iliac arteries. 3. Single bilateral renal arteries. Moderate stenosis near the origin of the right renal artery.  NON-VASCULAR  1. No acute abnormality in the abdomen or pelvis.  Carotid duplex 06/24/2023: Summary:  Right Carotid: Velocities in the right ICA are consistent with a 60-79% stenosis.  Left Carotid: Velocities in the left ICA are consistent with a 1-39% stenosis.  Vertebrals:Bilateral vertebral arteries demonstrate antegrade flow.  Subclavians: Normal flow hemodynamics were seen in bilateral subclavian arteries.   AAA duplex 05/12/2023: Summary:  Abdominal Aorta: There is evidence of abnormal dilatation of the mid and  distal Abdominal aorta. The largest aortic diameter has increased compared  to prior exam. Previous diameter measurement was obtained on 04/17/22:  4.28 x 4.44 cm.  Stenosis:  >50% stenosis of the proximal left CIA.   TTE 09/21/2021: 1. Left ventricular ejection fraction, by estimation,  is 60 to 65%. The  left ventricle has normal function. The left ventricle has no regional  wall motion abnormalities. Left ventricular diastolic parameters are  consistent with Grade I diastolic  dysfunction (impaired relaxation).  2. Right ventricular systolic function is normal. The right ventricular  size is normal.  3. The mitral valve is normal in structure. Trivial mitral valve  regurgitation. No evidence of mitral stenosis. There is mild holosystolic  prolapse of the middle segment of the anterior leaflet of the mitral  valve.  4. The aortic valve is tricuspid. There is moderate calcification of the  aortic valve. There is mild thickening of the aortic valve. Aortic valve  regurgitation is not visualized. Aortic valve sclerosis/calcification is  present, without any evidence of  aortic stenosis.  5. The inferior vena cava is normal in size with greater than 50%  respiratory variability, suggesting right atrial pressure of 3 mmHg.   Nuclear stress 11/19/2016:  Nuclear stress EF: 56%.  The study is normal.  This is a low risk study.  There was no ST segment deviation noted during stress.   Low risk stress nuclear study with normal perfusion and normal left ventricular regional and global systolic function.   )        Anesthesia Quick Evaluation

## 2023-06-27 NOTE — Progress Notes (Signed)
 Anesthesia Chart Review:  78 year old male follows with cardiology for history of CAD, DM, HLD, HTN, AAA, RBBB and current smoker. He had a NSTEMI in August of 2010 and was found to have a severe stenosis in the mid Circumflex artery treated with a large bare metal stent. LV function was normal in 2011. Nuclear stress test in July 2018 with no ischemia. Echo June 2023 with LVEF=60-65%. Normal RV function. Trivial mitral valve regurgitation. Aortic valve sclerosis without stenosis. His AAA has been followed in the Vascular surgery office by Dr. Myra Gianotti and has recently increased to 5.3 cm.  Last seen by Dr. Clifton Doria Fern on 06/26/2023.  Per note, " Pre-operative cardiovascular examination: He has no angina, CHF. He is very active. He has an upcoming endovascular procedure. EKG is unchanged today. He can proceed with has planned procedure without further cardiac workup."  Preop labs reviewed, WNL.  EKG 06/26/2023: NSR.  Rate 70.  Rightward axis.  Nonspecific intraventricular conduction block.  CTA abdomen pelvis 06/02/2023: IMPRESSION: VASCULAR   1. Infrarenal abdominal aortic aneurysm measuring up to 5.3 cm. Abdominal aortic aneurysm measured 4.1 cm in 2020. 2. Diffuse atherosclerotic disease in the aorta and iliac arteries. Mild narrowing near the origin of the left common iliac artery. Tortuosity in bilateral external iliac arteries. 3. Single bilateral renal arteries. Moderate stenosis near the origin of the right renal artery.   NON-VASCULAR   1. No acute abnormality in the abdomen or pelvis.  Carotid duplex 06/24/2023: Summary:  Right Carotid: Velocities in the right ICA are consistent with a 60-79% stenosis.  Left Carotid: Velocities in the left ICA are consistent with a 1-39% stenosis.  Vertebrals: Bilateral vertebral arteries demonstrate antegrade flow.  Subclavians: Normal flow hemodynamics were seen in bilateral subclavian arteries.   AAA duplex 05/12/2023: Summary:  Abdominal Aorta:  There is evidence of abnormal dilatation of the mid and  distal Abdominal aorta. The largest aortic diameter has increased compared  to prior exam. Previous diameter measurement was obtained on 04/17/22:  4.28 x 4.44 cm.  Stenosis:  >50% stenosis of the proximal left CIA.   TTE 09/21/2021: 1. Left ventricular ejection fraction, by estimation, is 60 to 65%. The  left ventricle has normal function. The left ventricle has no regional  wall motion abnormalities. Left ventricular diastolic parameters are  consistent with Grade I diastolic  dysfunction (impaired relaxation).   2. Right ventricular systolic function is normal. The right ventricular  size is normal.   3. The mitral valve is normal in structure. Trivial mitral valve  regurgitation. No evidence of mitral stenosis. There is mild holosystolic  prolapse of the middle segment of the anterior leaflet of the mitral  valve.   4. The aortic valve is tricuspid. There is moderate calcification of the  aortic valve. There is mild thickening of the aortic valve. Aortic valve  regurgitation is not visualized. Aortic valve sclerosis/calcification is  present, without any evidence of  aortic stenosis.   5. The inferior vena cava is normal in size with greater than 50%  respiratory variability, suggesting right atrial pressure of 3 mmHg.   Nuclear stress 11/19/2016: Nuclear stress EF: 56%. The study is normal. This is a low risk study. There was no ST segment deviation noted during stress.   Low risk stress nuclear study with normal perfusion and normal left ventricular regional and global systolic function.    Zannie Cove [facility] Short Stay Center/Anesthesiology Phone [phone] 06/27/2023 10:40 AM

## 2023-06-30 ENCOUNTER — Ambulatory Visit: Payer: PPO | Admitting: Surgery

## 2023-07-02 ENCOUNTER — Encounter (HOSPITAL_COMMUNITY)
Admission: RE | Disposition: A | Discharge status: Home / Self Care | Payer: Self-pay | Source: Home / Self Care | Attending: Surgery

## 2023-07-02 ENCOUNTER — Inpatient Hospital Stay (HOSPITAL_COMMUNITY): Admitting: Certified Registered Nurse Anesthetist

## 2023-07-02 ENCOUNTER — Inpatient Hospital Stay (HOSPITAL_COMMUNITY): Payer: Self-pay | Admitting: Physician Assistant

## 2023-07-02 ENCOUNTER — Other Ambulatory Visit: Payer: Self-pay

## 2023-07-02 ENCOUNTER — Inpatient Hospital Stay (HOSPITAL_COMMUNITY)

## 2023-07-02 ENCOUNTER — Inpatient Hospital Stay (HOSPITAL_COMMUNITY)
Admission: RE | Admit: 2023-07-02 | Discharge: 2023-07-03 | DRG: 269 | Disposition: A | Discharge status: Home / Self Care | Payer: HMO | Attending: Surgery | Admitting: Surgery

## 2023-07-02 ENCOUNTER — Encounter (HOSPITAL_COMMUNITY): Payer: Self-pay | Admitting: Surgery

## 2023-07-02 DIAGNOSIS — I7143 Infrarenal abdominal aortic aneurysm, without rupture: Secondary | ICD-10-CM | POA: Diagnosis not present

## 2023-07-02 DIAGNOSIS — I714 Abdominal aortic aneurysm, without rupture, unspecified: Secondary | ICD-10-CM | POA: Diagnosis present

## 2023-07-02 DIAGNOSIS — F17210 Nicotine dependence, cigarettes, uncomplicated: Secondary | ICD-10-CM

## 2023-07-02 DIAGNOSIS — I10 Essential (primary) hypertension: Secondary | ICD-10-CM | POA: Diagnosis present

## 2023-07-02 DIAGNOSIS — Z79899 Other long term (current) drug therapy: Secondary | ICD-10-CM

## 2023-07-02 DIAGNOSIS — E7800 Pure hypercholesterolemia, unspecified: Secondary | ICD-10-CM | POA: Diagnosis not present

## 2023-07-02 DIAGNOSIS — Z8249 Family history of ischemic heart disease and other diseases of the circulatory system: Secondary | ICD-10-CM | POA: Diagnosis not present

## 2023-07-02 DIAGNOSIS — I2510 Atherosclerotic heart disease of native coronary artery without angina pectoris: Secondary | ICD-10-CM | POA: Diagnosis present

## 2023-07-02 DIAGNOSIS — I252 Old myocardial infarction: Secondary | ICD-10-CM | POA: Diagnosis not present

## 2023-07-02 DIAGNOSIS — E785 Hyperlipidemia, unspecified: Secondary | ICD-10-CM | POA: Diagnosis not present

## 2023-07-02 DIAGNOSIS — Z8679 Personal history of other diseases of the circulatory system: Principal | ICD-10-CM

## 2023-07-02 DIAGNOSIS — E119 Type 2 diabetes mellitus without complications: Secondary | ICD-10-CM | POA: Diagnosis present

## 2023-07-02 DIAGNOSIS — Z7982 Long term (current) use of aspirin: Secondary | ICD-10-CM

## 2023-07-02 DIAGNOSIS — M1990 Unspecified osteoarthritis, unspecified site: Secondary | ICD-10-CM | POA: Diagnosis present

## 2023-07-02 HISTORY — PX: ABDOMINAL AORTIC ENDOVASCULAR STENT GRAFT: SHX5707

## 2023-07-02 HISTORY — PX: PATCH ANGIOPLASTY: SHX6230

## 2023-07-02 HISTORY — PX: ENDARTERECTOMY FEMORAL: SHX5804

## 2023-07-02 HISTORY — PX: ULTRASOUND GUIDANCE FOR VASCULAR ACCESS: SHX6516

## 2023-07-02 LAB — CBC
HCT: 34.8 % — ABNORMAL LOW (ref 39.0–52.0)
Hemoglobin: 11.9 g/dL — ABNORMAL LOW (ref 13.0–17.0)
MCH: 31.8 pg (ref 26.0–34.0)
MCHC: 34.2 g/dL (ref 30.0–36.0)
MCV: 93 fL (ref 80.0–100.0)
Platelets: 158 10*3/uL (ref 150–400)
RBC: 3.74 MIL/uL — ABNORMAL LOW (ref 4.22–5.81)
RDW: 12.7 % (ref 11.5–15.5)
WBC: 12.3 10*3/uL — ABNORMAL HIGH (ref 4.0–10.5)
nRBC: 0 % (ref 0.0–0.2)

## 2023-07-02 LAB — CREATININE, SERUM
Creatinine, Ser: 0.92 mg/dL (ref 0.61–1.24)
GFR, Estimated: >60 mL/min (ref >60)

## 2023-07-02 LAB — ABO/RH: ABO/RH(D): O POS

## 2023-07-02 LAB — POCT ACTIVATED CLOTTING TIME: Activated Clotting Time: 204 s

## 2023-07-02 SURGERY — INSERTION, ENDOVASCULAR STENT GRAFT, AORTA, ABDOMINAL
Anesthesia: General | Site: Groin | Laterality: Right

## 2023-07-02 MED ORDER — ONDANSETRON HCL 4 MG/2ML IJ SOLN: 4.0000 mg | Freq: Four times a day (QID) | INTRAMUSCULAR | Status: DC | PRN

## 2023-07-02 MED ORDER — IODIXANOL 320 MG/ML IV SOLN
INTRAVENOUS | Status: DC | PRN
  Administered 2023-07-02: 82 mL via INTRA_ARTERIAL

## 2023-07-02 MED ORDER — SUGAMMADEX SODIUM 200 MG/2ML IV SOLN
INTRAVENOUS | Status: DC | PRN
  Administered 2023-07-02: 157.8 mg via INTRAVENOUS

## 2023-07-02 MED ORDER — FENTANYL CITRATE (PF) 100 MCG/2ML IJ SOLN
25.0000 ug | INTRAMUSCULAR | Status: DC | PRN
  Administered 2023-07-02: 25 ug via INTRAVENOUS
  Administered 2023-07-02: 50 ug via INTRAVENOUS
  Administered 2023-07-02: 25 ug via INTRAVENOUS

## 2023-07-02 MED ORDER — NITROGLYCERIN 0.4 MG SL SUBL: 0.4000 mg | SUBLINGUAL_TABLET | SUBLINGUAL | Status: DC | PRN

## 2023-07-02 MED ORDER — FENTANYL CITRATE (PF) 250 MCG/5ML IJ SOLN
INTRAMUSCULAR | Status: DC | PRN
  Administered 2023-07-02 (×2): 50 ug via INTRAVENOUS
  Administered 2023-07-02: 100 ug via INTRAVENOUS
  Administered 2023-07-02: 50 ug via INTRAVENOUS

## 2023-07-02 MED ORDER — SODIUM CHLORIDE 0.9 % IV SOLN: INTRAVENOUS | Status: DC

## 2023-07-02 MED ORDER — PROPOFOL 500 MG/50ML IV EMUL
INTRAVENOUS | Status: DC | PRN
  Administered 2023-07-02: 25 ug/kg/min via INTRAVENOUS

## 2023-07-02 MED ORDER — GUAIFENESIN-DM 100-10 MG/5ML PO SYRP: 15.0000 mL | ORAL_SOLUTION | ORAL | Status: DC | PRN

## 2023-07-02 MED ORDER — ASPIRIN 81 MG PO TBEC
81.0000 mg | DELAYED_RELEASE_TABLET | Freq: Every day | ORAL | Status: DC
  Administered 2023-07-03: 81 mg via ORAL
  Filled 2023-07-02: qty 1

## 2023-07-02 MED ORDER — PHENYLEPHRINE 80 MCG/ML (10ML) SYRINGE FOR IV PUSH (FOR BLOOD PRESSURE SUPPORT)
PREFILLED_SYRINGE | INTRAVENOUS | Status: DC | PRN
Start: 2023-07-02 — End: 2023-07-02
  Administered 2023-07-02 (×2): 160 ug via INTRAVENOUS

## 2023-07-02 MED ORDER — ORAL CARE MOUTH RINSE: 15.0000 mL | Freq: Once | OROMUCOSAL | Status: AC

## 2023-07-02 MED ORDER — OXYCODONE HCL 5 MG/5ML PO SOLN: 5.0000 mg | Freq: Once | ORAL | Status: DC | PRN

## 2023-07-02 MED ORDER — CHLORHEXIDINE GLUCONATE 0.12 % MT SOLN
15.0000 mL | Freq: Once | OROMUCOSAL | Status: AC
  Administered 2023-07-02: 15 mL via OROMUCOSAL
  Filled 2023-07-02: qty 15

## 2023-07-02 MED ORDER — ALUM & MAG HYDROXIDE-SIMETH 200-200-20 MG/5ML PO SUSP: 15.0000 mL | ORAL | Status: DC | PRN

## 2023-07-02 MED ORDER — SODIUM CHLORIDE 0.9% FLUSH: 3.0000 mL | INTRAVENOUS | Status: DC | PRN

## 2023-07-02 MED ORDER — HEPARIN 6000 UNIT IRRIGATION SOLUTION
Status: DC | PRN
  Administered 2023-07-02: 3

## 2023-07-02 MED ORDER — FENTANYL CITRATE (PF) 250 MCG/5ML IJ SOLN
INTRAMUSCULAR | Status: AC
  Filled 2023-07-02: qty 5

## 2023-07-02 MED ORDER — HEPARIN SODIUM (PORCINE) 1000 UNIT/ML IJ SOLN
INTRAMUSCULAR | Status: DC | PRN
  Administered 2023-07-02: 1000 [IU] via INTRAVENOUS
  Administered 2023-07-02: 2000 [IU] via INTRAVENOUS
  Administered 2023-07-02: 1000 [IU] via INTRAVENOUS
  Administered 2023-07-02: 8000 [IU] via INTRAVENOUS

## 2023-07-02 MED ORDER — METOPROLOL SUCCINATE ER 25 MG PO TB24
25.0000 mg | ORAL_TABLET | Freq: Every day | ORAL | Status: DC
  Filled 2023-07-02: qty 1

## 2023-07-02 MED ORDER — ACETAMINOPHEN 325 MG PO TABS: 325.0000 mg | ORAL_TABLET | ORAL | Status: DC | PRN

## 2023-07-02 MED ORDER — ATORVASTATIN CALCIUM 40 MG PO TABS
40.0000 mg | ORAL_TABLET | Freq: Every day | ORAL | Status: DC
  Administered 2023-07-03: 40 mg via ORAL
  Filled 2023-07-02: qty 1

## 2023-07-02 MED ORDER — ROCURONIUM BROMIDE 10 MG/ML (PF) SYRINGE
PREFILLED_SYRINGE | INTRAVENOUS | Status: DC | PRN
  Administered 2023-07-02: 50 mg via INTRAVENOUS
  Administered 2023-07-02: 30 mg via INTRAVENOUS
  Administered 2023-07-02: 10 mg via INTRAVENOUS

## 2023-07-02 MED ORDER — LACTATED RINGERS IV SOLN: INTRAVENOUS | Status: DC | PRN

## 2023-07-02 MED ORDER — FENTANYL CITRATE (PF) 100 MCG/2ML IJ SOLN
INTRAMUSCULAR | Status: AC
  Filled 2023-07-02: qty 2

## 2023-07-02 MED ORDER — METOPROLOL TARTRATE 5 MG/5ML IV SOLN: 2.0000 mg | INTRAVENOUS | Status: DC | PRN

## 2023-07-02 MED ORDER — ONDANSETRON HCL 4 MG/2ML IJ SOLN
INTRAMUSCULAR | Status: DC | PRN
  Administered 2023-07-02: 4 mg via INTRAVENOUS

## 2023-07-02 MED ORDER — PHENYLEPHRINE HCL-NACL 20-0.9 MG/250ML-% IV SOLN
INTRAVENOUS | Status: DC | PRN
  Administered 2023-07-02: 20 ug/min via INTRAVENOUS

## 2023-07-02 MED ORDER — EZETIMIBE 10 MG PO TABS
10.0000 mg | ORAL_TABLET | Freq: Every day | ORAL | Status: DC
  Administered 2023-07-03: 10 mg via ORAL
  Filled 2023-07-02: qty 1

## 2023-07-02 MED ORDER — HEPARIN 6000 UNIT IRRIGATION SOLUTION
Status: AC
  Filled 2023-07-02: qty 500

## 2023-07-02 MED ORDER — LIDOCAINE 2% (20 MG/ML) 5 ML SYRINGE
INTRAMUSCULAR | Status: DC | PRN
Start: 2023-07-02 — End: 2023-07-02
  Administered 2023-07-02: 50 mg via INTRAVENOUS

## 2023-07-02 MED ORDER — CHLORHEXIDINE GLUCONATE CLOTH 2 % EX PADS: 6.0000 | MEDICATED_PAD | Freq: Once | CUTANEOUS | Status: DC

## 2023-07-02 MED ORDER — PROPOFOL 10 MG/ML IV BOLUS
INTRAVENOUS | Status: DC | PRN
  Administered 2023-07-02: 150 mg via INTRAVENOUS

## 2023-07-02 MED ORDER — MAGNESIUM SULFATE 2 GM/50ML IV SOLN: 2.0000 g | Freq: Every day | INTRAVENOUS | Status: DC | PRN

## 2023-07-02 MED ORDER — MORPHINE SULFATE (PF) 2 MG/ML IV SOLN: 2.0000 mg | INTRAVENOUS | Status: DC | PRN

## 2023-07-02 MED ORDER — OXYCODONE-ACETAMINOPHEN 5-325 MG PO TABS
1.0000 | ORAL_TABLET | ORAL | Status: DC | PRN
  Administered 2023-07-02 – 2023-07-03 (×2): 2 via ORAL
  Filled 2023-07-02 (×2): qty 2

## 2023-07-02 MED ORDER — 0.9 % SODIUM CHLORIDE (POUR BTL) OPTIME
TOPICAL | Status: DC | PRN
  Administered 2023-07-02: 1000 mL

## 2023-07-02 MED ORDER — POTASSIUM CHLORIDE CRYS ER 20 MEQ PO TBCR: 20.0000 meq | EXTENDED_RELEASE_TABLET | Freq: Every day | ORAL | Status: DC | PRN

## 2023-07-02 MED ORDER — LABETALOL HCL 5 MG/ML IV SOLN: 10.0000 mg | INTRAVENOUS | Status: DC | PRN

## 2023-07-02 MED ORDER — SURGIFLO WITH THROMBIN (HEMOSTATIC MATRIX KIT) OPTIME
TOPICAL | Status: DC | PRN
Start: 2023-07-02 — End: 2023-07-02
  Administered 2023-07-02: 1 via TOPICAL

## 2023-07-02 MED ORDER — ALBUMIN HUMAN 5 % IV SOLN
INTRAVENOUS | Status: DC | PRN
Start: 2023-07-02 — End: 2023-07-02

## 2023-07-02 MED ORDER — PROTAMINE SULFATE 10 MG/ML IV SOLN
INTRAVENOUS | Status: DC | PRN
  Administered 2023-07-02: 50 mg via INTRAVENOUS

## 2023-07-02 MED ORDER — SODIUM CHLORIDE 0.9 % IV SOLN: 500.0000 mL | Freq: Once | INTRAVENOUS | Status: DC | PRN

## 2023-07-02 MED ORDER — PHENOL 1.4 % MT LIQD: 1.0000 | OROMUCOSAL | Status: DC | PRN

## 2023-07-02 MED ORDER — PANTOPRAZOLE SODIUM 40 MG PO TBEC
40.0000 mg | DELAYED_RELEASE_TABLET | Freq: Every day | ORAL | Status: DC
  Administered 2023-07-03: 40 mg via ORAL
  Filled 2023-07-02: qty 1

## 2023-07-02 MED ORDER — HEPARIN SODIUM (PORCINE) 5000 UNIT/ML IJ SOLN
5000.0000 [IU] | Freq: Three times a day (TID) | INTRAMUSCULAR | Status: DC
  Administered 2023-07-03: 5000 [IU] via SUBCUTANEOUS
  Filled 2023-07-02: qty 1

## 2023-07-02 MED ORDER — ACETAMINOPHEN 650 MG RE SUPP: 325.0000 mg | RECTAL | Status: DC | PRN

## 2023-07-02 MED ORDER — OXYCODONE HCL 5 MG PO TABS: 5.0000 mg | ORAL_TABLET | Freq: Once | ORAL | Status: DC | PRN

## 2023-07-02 MED ORDER — DOCUSATE SODIUM 100 MG PO CAPS
100.0000 mg | ORAL_CAPSULE | Freq: Every day | ORAL | Status: DC
  Administered 2023-07-03: 100 mg via ORAL
  Filled 2023-07-02: qty 1

## 2023-07-02 MED ORDER — CEFAZOLIN SODIUM-DEXTROSE 2-4 GM/100ML-% IV SOLN
2.0000 g | INTRAVENOUS | Status: AC
  Administered 2023-07-02: 2 g via INTRAVENOUS
  Filled 2023-07-02: qty 100

## 2023-07-02 MED ORDER — EPHEDRINE SULFATE-NACL 50-0.9 MG/10ML-% IV SOSY
PREFILLED_SYRINGE | INTRAVENOUS | Status: DC | PRN
  Administered 2023-07-02: 5 mg via INTRAVENOUS
  Administered 2023-07-02: 10 mg via INTRAVENOUS

## 2023-07-02 MED ORDER — SODIUM CHLORIDE 0.9% FLUSH: 3.0000 mL | Freq: Two times a day (BID) | INTRAVENOUS | Status: DC

## 2023-07-02 MED ORDER — HYDRALAZINE HCL 20 MG/ML IJ SOLN: 5.0000 mg | INTRAMUSCULAR | Status: DC | PRN

## 2023-07-02 MED ORDER — DEXAMETHASONE SODIUM PHOSPHATE 10 MG/ML IJ SOLN
INTRAMUSCULAR | Status: DC | PRN
  Administered 2023-07-02: 10 mg via INTRAVENOUS

## 2023-07-02 MED ORDER — CEFAZOLIN SODIUM-DEXTROSE 2-4 GM/100ML-% IV SOLN
2.0000 g | Freq: Three times a day (TID) | INTRAVENOUS | Status: AC
  Administered 2023-07-02 – 2023-07-03 (×2): 2 g via INTRAVENOUS
  Filled 2023-07-02 (×2): qty 100

## 2023-07-02 MED ORDER — SUGAMMADEX SODIUM 200 MG/2ML IV SOLN
INTRAVENOUS | Status: AC
  Filled 2023-07-02: qty 2

## 2023-07-02 SURGICAL SUPPLY — 65 items
BAG COUNTER SPONGE SURGICOUNT (BAG) ×3 IMPLANT
BLADE CLIPPER SURG (BLADE) ×3 IMPLANT
CANISTER SUCT 3000ML PPV (MISCELLANEOUS) ×3 IMPLANT
CATH BEACON 5.038 65CM KMP-01 (CATHETERS) ×3 IMPLANT
CATH EMB 4FR 40 (CATHETERS) IMPLANT
CATH OMNI FLUSH .035X70CM (CATHETERS) ×3 IMPLANT
CLIP TI MEDIUM 24 (CLIP) IMPLANT
CLIP TI WIDE RED SMALL 24 (CLIP) IMPLANT
DERMABOND ADVANCED .7 DNX12 (GAUZE/BANDAGES/DRESSINGS) ×3 IMPLANT
DEVICE CLOSURE PERCLS PRGLD 6F (VASCULAR PRODUCTS) ×12 IMPLANT
DEVICE TORQUE H2O (MISCELLANEOUS) IMPLANT
DEVICE TORQUE SEADRAGON GRN (MISCELLANEOUS) IMPLANT
DRSG TEGADERM 2-3/8X2-3/4 SM (GAUZE/BANDAGES/DRESSINGS) ×6 IMPLANT
ELECT CAUTERY BLADE 6.4 (BLADE) ×3 IMPLANT
ELECT REM PT RETURN 9FT ADLT (ELECTROSURGICAL) ×6 IMPLANT
ELECTRODE REM PT RTRN 9FT ADLT (ELECTROSURGICAL) ×6 IMPLANT
EXCLDR EXT ENDO 36X4.5 18F (Endovascular Graft) ×3 IMPLANT
EXCLDR TRNK ENDO 36X14.5X14 18 (Endovascular Graft) ×3 IMPLANT
EXCLUDER EXT ENDO 36X4.5 18F (Endovascular Graft) IMPLANT
EXCLUDER TNK END 36X14.5X14 18 (Endovascular Graft) IMPLANT
GAUZE SPONGE 2X2 8PLY STRL LF (GAUZE/BANDAGES/DRESSINGS) ×6 IMPLANT
GLOVE SURG SS PI 7.5 STRL IVOR (GLOVE) ×9 IMPLANT
GOWN STRL REUS W/ TWL LRG LVL3 (GOWN DISPOSABLE) ×6 IMPLANT
GOWN STRL REUS W/ TWL XL LVL3 (GOWN DISPOSABLE) ×6 IMPLANT
GRAFT BALLN CATH 65CM (BALLOONS) ×3 IMPLANT
GUIDEWIRE ANGLED .035X150CM (WIRE) IMPLANT
KIT BASIN OR (CUSTOM PROCEDURE TRAY) ×3 IMPLANT
KIT DRAIN CSF ACCUDRAIN (MISCELLANEOUS) IMPLANT
KIT TURNOVER KIT B (KITS) ×3 IMPLANT
LEG CONTRALATERAL 16X16X9.5 (Endovascular Graft) IMPLANT
LOOP VASCLR MAXI BLUE 18IN ST (MISCELLANEOUS) IMPLANT
LOOP VESSEL MINI RED (MISCELLANEOUS) IMPLANT
NS IRRIG 1000ML POUR BTL (IV SOLUTION) ×3 IMPLANT
PACK ENDOVASCULAR (PACKS) ×3 IMPLANT
PAD ARMBOARD 7.5X6 YLW CONV (MISCELLANEOUS) ×6 IMPLANT
PATCH HEMASHIELD 8X75 (Vascular Products) IMPLANT
PENCIL BUTTON HOLSTER BLD 10FT (ELECTRODE) ×3 IMPLANT
PERCLOSE PROGLIDE 6F (VASCULAR PRODUCTS) ×15 IMPLANT
SET MICROPUNCTURE 5F STIFF (MISCELLANEOUS) ×3 IMPLANT
SHEATH BRITE TIP 8FR 23CM (SHEATH) ×3 IMPLANT
SHEATH DRYSEAL FLEX 12FR 33CM (SHEATH) IMPLANT
SHEATH DRYSEAL FLEX 18FR 33CM (SHEATH) IMPLANT
SHEATH PINNACLE 8F 10CM (SHEATH) ×3 IMPLANT
STENT GRAFT CONTRALAT 16X11.5 (Endovascular Graft) IMPLANT
STOPCOCK 4 WAY LG BORE MALE ST (IV SETS) IMPLANT
STOPCOCK MORSE 400PSI 3WAY (MISCELLANEOUS) ×3 IMPLANT
SURGIFLO W/THROMBIN 8M KIT (HEMOSTASIS) IMPLANT
SUT PROLENE 5 0 C 1 24 (SUTURE) IMPLANT
SUT PROLENE 6 0 BV (SUTURE) IMPLANT
SUT SILK 2-0 18XBRD TIE 12 (SUTURE) IMPLANT
SUT SILK 3-0 18XBRD TIE 12 (SUTURE) IMPLANT
SUT SILK 4-0 18XBRD TIE 12 (SUTURE) IMPLANT
SUT VIC AB 2-0 CT1 TAPERPNT 27 (SUTURE) IMPLANT
SUT VIC AB 3-0 SH 27X BRD (SUTURE) IMPLANT
SUT VIC AB 4-0 PS2 18 (SUTURE) IMPLANT
SUT VICRYL 4-0 PS2 18IN ABS (SUTURE) IMPLANT
SYR 20ML LL LF (SYRINGE) ×3 IMPLANT
SYR 3ML LL SCALE MARK (SYRINGE) IMPLANT
SYR BULB IRRIG 60ML STRL (SYRINGE) IMPLANT
TIE VASCULAR MAXI BLUE 18IN ST (MISCELLANEOUS) ×3 IMPLANT
TOWEL GREEN STERILE (TOWEL DISPOSABLE) ×3 IMPLANT
TRAY FOLEY MTR SLVR 16FR STAT (SET/KITS/TRAYS/PACK) ×3 IMPLANT
TUBING HIGH PRESSURE 120CM (CONNECTOR) ×3 IMPLANT
WIRE AMPLATZ SS-J .035X180CM (WIRE) ×6 IMPLANT
WIRE BENTSON .035X145CM (WIRE) ×6 IMPLANT

## 2023-07-02 NOTE — Anesthesia Procedure Notes (Signed)
 Arterial Line Insertion Start/End3/03/2024 8:00 AM, 07/02/2023 8:20 AM Performed by: Darryl Nestle, CRNA, CRNA  Patient location: Pre-op. Preanesthetic checklist: patient identified, IV checked, site marked, risks and benefits discussed, surgical consent, monitors and equipment checked, pre-op evaluation, timeout performed and anesthesia consent Lidocaine 1% used for infiltration Left, radial was placed Catheter size: 20 G Hand hygiene performed  and maximum sterile barriers used   Attempts: 3 Procedure performed using ultrasound guided technique. Ultrasound Notes:anatomy identified, needle tip was noted to be adjacent to the nerve/plexus identified and no ultrasound evidence of intravascular and/or intraneural injection Following insertion, dressing applied and Biopatch. Post procedure assessment: normal and unchanged  Post procedure complications: unsuccessful attempts. Patient tolerated the procedure well with no immediate complications.

## 2023-07-02 NOTE — Anesthesia Procedure Notes (Signed)
 Procedure Name: Intubation Date/Time: 07/02/2023 8:50 AM  Performed by: Darryl Nestle, CRNAPre-anesthesia Checklist: Patient identified, Emergency Drugs available, Suction available and Patient being monitored Patient Re-evaluated:Patient Re-evaluated prior to induction Oxygen Delivery Method: Circle system utilized Preoxygenation: Pre-oxygenation with 100% oxygen Induction Type: IV induction Ventilation: Mask ventilation without difficulty Laryngoscope Size: Mac and 3 Grade View: Grade I Tube type: Oral Tube size: 7.0 mm Number of attempts: 1 Airway Equipment and Method: Stylet and Oral airway Placement Confirmation: ETT inserted through vocal cords under direct vision, positive ETCO2 and breath sounds checked- equal and bilateral Secured at: 23 cm Tube secured with: Tape Dental Injury: Teeth and Oropharynx as per pre-operative assessment

## 2023-07-02 NOTE — Op Note (Addendum)
 Patient name: [name] MRN: [id number] DOB: 1946-04-01 Sex: male  07/02/2023 Pre-operative Diagnosis: AAA Post-operative diagnosis:  Same Surgeon:  Durene Cal Assistants:  Aggie Moats, PA Procedure:   #1: Endovascular repair an abdominal aortic aneurysm   #2: Bilateral ultrasound-guided percutaneous common femoral artery access   #3: Open exposure right common femoral artery   #4: Right femoral endarterectomy with dacryon patch angioplasty   #5: Proximal extension  Anesthesia:  general Blood Loss: See anesthesia record Specimens: none Findings:  Complete exclusion.  A proximal extension was necessary due to the angulation of the aneurysm neck.  After the procedure, I was unsatisfied with blood flow to the right foot and so the right femoral artery was exposed.  He had extensive plaque with a flap that necessitated endarterectomy and patch angioplasty.  Indications: This is a 78 year old gentleman with a 5.3 cm infrarenal abdominal aortic aneurysm comes in today for repair  Devices used: Main body was primary right Gore 36 x 14 x 14.  Ipsilateral right limb was a Gore 16 x 10.  Contralateral left was a 16 x 12.  A 36 x 4.5 cuff was utilized.  Procedure:  The patient was identified in the holding area and taken to [facility] OR ROOM 16  The patient was then placed supine on the table. general anesthesia was administered.  The patient was prepped and draped in the usual sterile fashion.  A time out was called and antibiotics were administered.  A PA was necessary to expedite the procedure and assist with technical details.  He helped with wire exchanges, device deployment, and the patch repair by following the suture.  Ultrasound was used to evaluate bilateral common femoral arteries.  Both had posterior plaque but no significant plaque anteriorly.  A #11 blade was used to make a skin nick.  Bilateral common femoral arteries were cannulated under ultrasound guidance with a micropuncture  needle.  A 018 wire was inserted followed by placement of a micropuncture sheath.  A Bentson wire was then inserted.  An 8 [place] dilator was used to dilate the subcutaneous tract and Pro-glide devices were deployed at the 11:00 and 1 o'clock position for preclosure.  8 French sheaths were placed bilaterally and the patient was fully heparinized.  I then advanced Amplatz wires into the descending thoracic aorta and a 12 French sheath was placed on the 18 French sheath on the right.  The main body device was prepared on the back table.  This was a Gore 36 x 14 x 14 device.  It was advanced up the right side.  A pigtail catheter was placed up the left.  An abdominal aortogram was performed locating the renal arteries.  There was significant angulation and a conical neck.  I angled the conformable device and then deployed it.  It dropped down because of the conical neck and so we constrained it to try to advance it.  Once I was satisfied with this position, I cannulated the contralateral gate with a Berenstein 2 catheter and a Bentson wire.  A pigtail catheter was then advanced into the main body and rotated, confirming successful cannulation.  A retrograde injection was performed through the sheath in the left groin locating the left hypogastric artery.  The left limb was prepared on the back table.  This was a 16 x 12 device.  It was deployed landing just proximal to the left hypogastric artery.  Next, the remaining portion of the ipsilateral limb was deployed and  the delivery system was removed.  A retrograde injection was performed to the sheath in the right groin in a LAO position locating the right hypogastric artery.  A 16 x 10 limb was prepared on the back table and inserted.  This was deployed landing just short of the hypogastric artery on the right.  Next, a MOB balloon was used to mold the proximal and distal attachment sites and a completion arteriogram was performed.  This showed successful exclusion of  the aneurysm sac with preservation of blood flow through the hypogastric arteries.  I felt that because of the angulation the device did not sit well on the left side of the aorta.  Because I wanted more coverage I felt that the cuff was a next option.  A 36 x 4.5 cuff was then inserted and deployed.  This was again molded with the balloon.  Final imaging showed significant improvement in the coverage of the aortic neck.  I was satisfied with these results.  Bentson wires were placed up each side.  The Pro-glide devices were then used to close the arteriotomy sites.  The patient had good signals in the left foot but not on the right.  I did not feel a good pulse distal to the cannulation site on the right and felt that there was likely a Pro-glide disruption of the intima which necessitated open exposure.  I extended the skin neck by making a oblique incision in the groin crease.  I dissected down and expose the common femoral artery as well as the profundus and superficial femoral artery.  I then opened the common femoral artery.  There was a significant flap from the posterior plaque.  This could not be tacked down primarily and so I opened up the common femoral artery longitudinally and performed an endarterectomy removing the plaque down into the superficial femoral and proximal profundofemoral artery.  Once I was satisfied with the endarterectomy, a dacryon patch was used for patch angioplasty which was sewn in with a 5-0 Prolene.  Prior to completion the appropriate flushing maneuvers were performed and the repair was completed.  Several repair stitches were required for hemostasis.  At this point we checked signals in the right foot which were significantly improved.  He also had excellent profundus and superficial femoral Doppler signals.  I then reversed the heparin with 50 mg of protamine.  The wound was irrigated.  Hemostasis was achieved.  The femoral sheath was reapproximated with 2-0 Vicryl.  The  subcutaneous tissue was closed with an additional layer 3-0 Vicryl followed by subcuticular closure and Dermabond.  The patient was successfully extubated and taken recovery in stable condition.   Disposition: To PACU stable.   Juleen China, M.D., [facility] [place] Office: [phone] Pager:  [phone]

## 2023-07-02 NOTE — Plan of Care (Signed)
  Problem: Education: Goal: Knowledge of General Education information will improve Description: Including pain rating scale, medication(s)/side effects and non-pharmacologic comfort measures Outcome: Progressing   Problem: Health Behavior/Discharge Planning: Goal: Ability to manage health-related needs will improve Outcome: Progressing   Problem: Clinical Measurements: Goal: Ability to maintain clinical measurements within normal limits will improve Outcome: Progressing Goal: Will remain free from infection Outcome: Progressing Goal: Respiratory complications will improve Outcome: Progressing Goal: Cardiovascular complication will be avoided Outcome: Progressing   Problem: Nutrition: Goal: Adequate nutrition will be maintained Outcome: Progressing   Problem: Coping: Goal: Level of anxiety will decrease Outcome: Progressing   Problem: Elimination: Goal: Will not experience complications related to bowel motility Outcome: Progressing Goal: Will not experience complications related to urinary retention Outcome: Progressing

## 2023-07-02 NOTE — Interval H&P Note (Signed)
 History and Physical Interval Note:  07/02/2023 6:55 AM  [name]  has presented today for surgery, with the diagnosis of Infrarenal abdominal aortic aneurysm without rupture.  The various methods of treatment have been discussed with the patient and family. After consideration of risks, benefits and other options for treatment, the patient has consented to  Procedure(s): INSERTION, ENDOVASCULAR STENT GRAFT, AORTA, ABDOMINAL (N/A) as a surgical intervention.  The patient's history has been reviewed, patient examined, no change in status, stable for surgery.  I have reviewed the patient's chart and labs.  Questions were answered to the patient's satisfaction.     Durene Cal

## 2023-07-02 NOTE — Transfer of Care (Signed)
 Immediate Anesthesia Transfer of Care Note  Patient: [name]  Procedure(s) Performed: INSERTION, ENDOVASCULAR STENT GRAFT, AORTA, ABDOMINAL ANGIOPLASTY, USING PATCH GRAFT (Right: Groin) ULTRASOUND GUIDANCE (Bilateral: Groin) ENDARTERECTOMY, FEMORAL (Right: Groin)  Patient Location: PACU  Anesthesia Type:General  Level of Consciousness: drowsy  Airway & Oxygen Therapy: Patient Spontanous Breathing and Patient connected to face mask oxygen  Post-op Assessment: Report given to RN and Post -op Vital signs reviewed and stable  Post vital signs: Reviewed and stable  Last Vitals:  Vitals Value Taken Time  BP 107/55 07/02/23 1247  Temp 98   Pulse 90 07/02/23 1254  Resp 9 07/02/23 1254  SpO2 100 % 07/02/23 1254  Vitals shown include unfiled device data.  Last Pain:  Vitals:   07/02/23 0720  TempSrc:   PainSc: 0-No pain         Complications: No notable events documented.

## 2023-07-03 LAB — CBC
HCT: 33.4 % — ABNORMAL LOW (ref 39.0–52.0)
Hemoglobin: 11.4 g/dL — ABNORMAL LOW (ref 13.0–17.0)
MCH: 31.8 pg (ref 26.0–34.0)
MCHC: 34.1 g/dL (ref 30.0–36.0)
MCV: 93 fL (ref 80.0–100.0)
Platelets: 178 10*3/uL (ref 150–400)
RBC: 3.59 MIL/uL — ABNORMAL LOW (ref 4.22–5.81)
RDW: 12.7 % (ref 11.5–15.5)
WBC: 16.3 10*3/uL — ABNORMAL HIGH (ref 4.0–10.5)
nRBC: 0 % (ref 0.0–0.2)

## 2023-07-03 LAB — BASIC METABOLIC PANEL
Anion gap: 9 (ref 5–15)
BUN: 18 mg/dL (ref 8–23)
CO2: 21 mmol/L — ABNORMAL LOW (ref 22–32)
Calcium: 8.4 mg/dL — ABNORMAL LOW (ref 8.9–10.3)
Chloride: 106 mmol/L (ref 98–111)
Creatinine, Ser: 0.92 mg/dL (ref 0.61–1.24)
GFR, Estimated: >60 mL/min (ref >60)
Glucose, Bld: 126 mg/dL — ABNORMAL HIGH (ref 70–99)
Potassium: 4 mmol/L (ref 3.5–5.1)
Sodium: 136 mmol/L (ref 135–145)

## 2023-07-03 LAB — POCT ACTIVATED CLOTTING TIME
Activated Clotting Time: 210 s
Activated Clotting Time: 227 s
Activated Clotting Time: 210 s
Activated Clotting Time: 216 s

## 2023-07-03 MED ORDER — HYDROCODONE-ACETAMINOPHEN 5-325 MG PO TABS: 1.0000 | ORAL_TABLET | Freq: Four times a day (QID) | ORAL | 0 refills | Status: DC | PRN

## 2023-07-03 MED ORDER — HYDROCODONE-ACETAMINOPHEN 5-325 MG PO TABS: 1.0000 | ORAL_TABLET | Freq: Four times a day (QID) | ORAL | 0 refills | Status: AC | PRN

## 2023-07-03 NOTE — Discharge Summary (Signed)
 [facility] Discharge Summary   [name] 11-03-1945 78 y.o. male  MRN: [id number]  Admission Date: 07/02/2023  Discharge Date: 07/03/23  Physician: Dr. Myra Gianotti  Admission Diagnosis: S/P abdominal aortic aneurysm repair [Z98.890, Z86.79] AAA (abdominal aortic aneurysm) without rupture [facility]) [I71.40]  Discharge Day services:    See progress note 07/03/23  Hospital Course:  [name] is a 78 year old male who was brought in as an outpatient and underwent endovascular repair of abdominal aortic aneurysm by Dr. Myra Gianotti on 07/02/2023.  Due to failure of the closure device in the right common femoral artery he required cutdown involving endarterectomy with dacryon patch angioplasty.  He tolerated procedure well and was admitted to the hospital postoperatively POD #1 bilateral lower extremities are well-perfused.  He now has a palpable right DP pulse after common femoral artery repair.  He is ambulating well and voiding after Foley removal.  He is ready for discharge home.  He will follow-up in office in about 1 month with a CTA abdomen and pelvis to see Dr. Myra Gianotti.  He was prescribed narcotic pain medication for continued postoperative pain control.  He was discharged home in stable condition.  CBC    Component Value Date/Time   WBC 16.3 (H) 07/03/2023 0415   RBC 3.59 (L) 07/03/2023 0415   HGB 11.4 (L) 07/03/2023 0415   HCT 33.4 (L) 07/03/2023 0415   PLT 178 07/03/2023 0415   MCV 93.0 07/03/2023 0415   MCH 31.8 07/03/2023 0415   MCHC 34.1 07/03/2023 0415   RDW 12.7 07/03/2023 0415   LYMPHSABS 1.7 08/01/2012 2200   MONOABS 0.8 08/01/2012 2200   EOSABS 0.2 08/01/2012 2200   BASOSABS 0.0 08/01/2012 2200    BMET    Component Value Date/Time   NA 136 07/03/2023 0415   K 4.0 07/03/2023 0415   CL 106 07/03/2023 0415   CO2 21 (L) 07/03/2023 0415   GLUCOSE 126 (H) 07/03/2023 0415   BUN 18 07/03/2023 0415   CREATININE 0.92 07/03/2023 0415   CALCIUM 8.4 (L) 07/03/2023 0415    GFRNONAA >60 07/03/2023 0415   GFRAA >60 04/28/2018 0821         Discharge Diagnosis:  S/P abdominal aortic aneurysm repair [W09.811, Z86.79] AAA (abdominal aortic aneurysm) without rupture (HCC) [I71.40]  Secondary Diagnosis: Patient Active Problem List   Diagnosis Date Noted   S/P abdominal aortic aneurysm repair 07/02/2023   AAA (abdominal aortic aneurysm) without rupture (HCC) 07/02/2023   Right inguinal hernia 03/10/2018   Hypertensive disorder 11/25/2017   Degenerative lumbar spinal stenosis 11/11/2017   Degenerative scoliosis 11/11/2017   Somatic dysfunction of right sacroiliac joint 11/11/2017   CAD, NATIVE VESSEL 12/29/2008   DYSLIPIDEMIA 12/24/2008   TOBACCO USER 12/24/2008   CHEST PAIN-UNSPECIFIED 12/24/2008   Past Medical History:  Diagnosis Date   Arthritis    CAD, NATIVE VESSEL 12/29/2008   Qualifier: Diagnosis of  By: Clifton James, MD, Christopher     CHEST PAIN-UNSPECIFIED 12/24/2008   Qualifier: Diagnosis of  By: Denyse Amass CMA, Carol     Coronary artery disease    DYSLIPIDEMIA 12/24/2008   Qualifier: Diagnosis of  By: Denyse Amass CMA, Carol     Hyperlipidemia    Hypertension    MI (myocardial infarction) (HCC)    TOBACCO USER 12/24/2008   Qualifier: Diagnosis of  By: Denyse Amass, CMA, Carol       Allergies as of 07/03/2023   No Known Allergies      Medication List     TAKE these medications  acetaminophen 650 MG CR tablet Commonly known as: TYLENOL Take 650-1,300 mg by mouth every 8 (eight) hours as needed for pain.   aspirin EC 81 MG tablet Take 81 mg by mouth daily.   atorvastatin 40 MG tablet Commonly known as: LIPITOR Take 40 mg by mouth in the morning.   docusate sodium 100 MG capsule Commonly known as: COLACE Take 1 capsule (100 mg total) by mouth 2 (two) times daily. What changed: when to take this   ezetimibe 10 MG tablet Commonly known as: ZETIA Take 10 mg by mouth in the morning.   FISH OIL PO Take 1,200 mg by mouth daily.    HYDROcodone-acetaminophen 5-325 MG tablet Commonly known as: NORCO/VICODIN Take 1 tablet by mouth every 6 (six) hours as needed. What changed: when to take this   ibuprofen 200 MG tablet Commonly known as: ADVIL Take 600 mg by mouth every 8 (eight) hours as needed (pain.).   ibuprofen 800 MG tablet Commonly known as: ADVIL Take 800 mg by mouth 3 (three) times daily as needed.   ICY HOT EX Apply 1 application topically daily as needed (pain).   metoprolol succinate 25 MG 24 hr tablet Commonly known as: TOPROL-XL TAKE 1 TABLET BY MOUTH ONCE DAILY.   nitroGLYCERIN 0.4 MG SL tablet Commonly known as: NITROSTAT Place 0.4 mg under the tongue every 5 (five) minutes x 3 doses as needed (if no relief after 3rd dose, proceed to ED or call 911).   telmisartan 40 MG tablet Commonly known as: MICARDIS Take 20 mg by mouth in the morning.        Discharge Instructions:   [facility]  Discharge Instructions Endovascular Aortic Aneurysm Repair  Please refer to the following instructions for your post-procedure care. Your surgeon or Physician Assistant will discuss any changes with you.  Activity  You are encouraged to walk as much as you can. You can slowly return to normal activities but must avoid strenuous activity and heavy lifting until your doctor tells you it's OK. Avoid activities such as vacuuming or swinging a gold club. It is normal to feel tired for several weeks after your surgery. Do not drive until your doctor gives the OK and you are no longer taking prescription pain medications. It is also normal to have difficulty with sleep habits, eating, and bowel movements after surgery. These will go away with time.  Bathing/Showering  You may shower after you go home. If you have an incision, do not soak in a bathtub, hot tub, or swim until the incision heals completely.  Incision Care  Shower every day. Clean your incision with mild soap and  water. Pat the area dry with a clean towel. You do not need a bandage unless otherwise instructed. Do not apply any ointments or creams to your incision. If you clothing is irritating, you may cover your incision with a dry gauze pad.  Diet  Resume your normal diet. There are no special food restrictions following this procedure. A low fat/low cholesterol diet is recommended for all patients with vascular disease. In order to heal from your surgery, it is CRITICAL to get adequate nutrition. Your body requires vitamins, minerals, and protein. Vegetables are the best source of vitamins and minerals. Vegetables also provide the perfect balance of protein. Processed food has little nutritional value, so try to avoid this.  Medications  Resume taking all of your medications unless your doctor or Physician Assistnat tells you not to. If  your incision is causing pain, you may take over-the-counter pain relievers such as acetaminophen (Tylenol). If you were prescribed a stronger pain medication, please be aware these medications can cause nausea and constipation. Prevent nausea by taking the medication with a snack or meal. Avoid constipation by drinking plenty of fluids and eating foods with a high amount of fiber, such as fruits, vegetables, and grains. Do not take Tylenol if you are taking prescription pain medications.   Follow up  Our office will schedule a follow-up appointment with a C.T. scan 3-4 weeks after your surgery.  Please call us immediately for any of the following conditions  Severe or worsening pain in your legs or feet or in your abdomen back or chest. Increased pain, redness, drainage (pus) from your incision sit. Increased abdominal pain, bloating, nausea, vomiting or persistent diarrhea. Fever of 101 degrees or higher. Swelling in your leg (s),  Reduce your risk of vascular disease  Stop smoking. If you would like help call [facility] at 1-800-QUIT-NOW (815 290 4286) or [facility] at [phone]. Manage your cholesterol Maintain a desired weight Control your diabetes Keep your blood pressure down  If you have questions, please call the office at [phone].     Disposition: home  Patient's condition: is Good  Follow up: 1. Dr. Myra Gianotti in 4 weeks with CTA protocol   Emilie Rutter, PA-C [facility] [phone] 07/03/2023  10:48 AM   - For VQI Registry use - Post-op:  Time to Extubation: [x]  In OR, [ ]  < 12 hrs, [ ]  12-24 hrs, [ ]  >=24 hrs Vasopressors Req. Post-op: No MI: No., [ ]  Troponin only, [ ]  EKG or Clinical New Arrhythmia: No CHF: No ICU Stay: 0 day in ICU Transfusion: No      Complications: Resp failure: No., [ ]  Pneumonia, [ ]  Ventilator Chg in renal function: No., [ ]  Inc. Cr > 0.5, [ ]  Temp. Dialysis,  [ ]  Permanent dialysis Leg ischemia: No., no Surgery needed, [ ]  Yes, Surgery needed,  [ ]  Amputation Bowel ischemia: No., [ ]  Medical Rx, [ ]  Surgical Rx Wound complication: No., [ ]  Superficial separation/infection, [ ]  Return to OR Return to OR: No  Return to OR for bleeding: No Stroke: No., [ ]  Minor, [ ]  Major  Discharge medications: Statin use:  Yes  ASA use:  Yes  Plavix use:  No  Beta blocker use:  Yes  ARB use:  Yes ACEI use:  No CCB use:  No

## 2023-07-03 NOTE — Progress Notes (Addendum)
  Progress Note    07/03/2023 7:44 AM 1 Day Post-Op  Subjective:  no complaints   Vitals:   07/02/23 2319 07/03/23 0333  BP: 104/68 114/69  Pulse: (!) 104 94  Resp: 15 12  Temp: 98.5 F (36.9 C) 98.5 F (36.9 C)  SpO2: 92% 96%   Physical Exam: Lungs:  non labored Incisions:  R groin soft; L groin without hematoma Extremities:  palpable R DP; L DP and PT by doppler Abdomen:  soft Neurologic: A&O  CBC    Component Value Date/Time   WBC 16.3 (H) 07/03/2023 0415   RBC 3.59 (L) 07/03/2023 0415   HGB 11.4 (L) 07/03/2023 0415   HCT 33.4 (L) 07/03/2023 0415   PLT 178 07/03/2023 0415   MCV 93.0 07/03/2023 0415   MCH 31.8 07/03/2023 0415   MCHC 34.1 07/03/2023 0415   RDW 12.7 07/03/2023 0415   LYMPHSABS 1.7 08/01/2012 2200   MONOABS 0.8 08/01/2012 2200   EOSABS 0.2 08/01/2012 2200   BASOSABS 0.0 08/01/2012 2200    BMET    Component Value Date/Time   NA 136 07/03/2023 0415   K 4.0 07/03/2023 0415   CL 106 07/03/2023 0415   CO2 21 (L) 07/03/2023 0415   GLUCOSE 126 (H) 07/03/2023 0415   BUN 18 07/03/2023 0415   CREATININE 0.92 07/03/2023 0415   CALCIUM 8.4 (L) 07/03/2023 0415   GFRNONAA >60 07/03/2023 0415   GFRAA >60 04/28/2018 0821    INR    Component Value Date/Time   INR 1.1 06/26/2023 0900     Intake/Output Summary (Last 24 hours) at 07/03/2023 0744 Last data filed at 07/03/2023 1610 Gross per 24 hour  Intake 2255.29 ml  Output 2635 ml  Net -379.71 ml     Assessment/Plan:  78 y.o. male is s/p EVAR with R groin cutdown involving CFA endarterectomy1 Day Post-Op   BLE well perfused R groin incision well appearing; L groin also well appearing without hematoma D/c foley A line discontinued; palpable radial distal to a line site [facility] for discharge if ambulating and voiding; office will arrange CTA a/p in 3-4 weeks    Emilie Rutter, PA-C [facility] [phone] 07/03/2023 7:44 AM   I agree with the above.  Have seen and  evaluated the patient.  He is postoperative #1 from endovascular aneurysm repair with right femoral endarterectomy.  He did well overnight.  He is ambulating.  He is tolerating regular diet.  He is yet to void.  We will anticipate discharge home later today.  He will follow-up in 1 month with CT angiogram  Durene Cal

## 2023-07-03 NOTE — Progress Notes (Signed)
 Discharge instructions (including medications) discussed with and copy provided to patient/caregiver

## 2023-07-03 NOTE — Anesthesia Postprocedure Evaluation (Signed)
 Anesthesia Post Note  Patient: [name]  Procedure(s) Performed: INSERTION, ENDOVASCULAR STENT GRAFT, AORTA, ABDOMINAL ANGIOPLASTY, USING PATCH GRAFT (Right: Groin) ULTRASOUND GUIDANCE (Bilateral: Groin) ENDARTERECTOMY, FEMORAL (Right: Groin)     Patient location during evaluation: PACU Anesthesia Type: General Level of consciousness: awake and alert Pain management: pain level controlled Vital Signs Assessment: post-procedure vital signs reviewed and stable Respiratory status: spontaneous breathing, nonlabored ventilation, respiratory function stable and patient connected to nasal cannula oxygen Cardiovascular status: blood pressure returned to baseline and stable Postop Assessment: no apparent nausea or vomiting Anesthetic complications: no   No notable events documented.  Last Vitals:  Vitals:   07/02/23 2319 07/03/23 0333  BP: 104/68 114/69  Pulse: (!) 104 94  Resp: 15 12  Temp: 36.9 C 36.9 C  SpO2: 92% 96%    Last Pain:  Vitals:   07/03/23 0442  TempSrc:   PainSc: 3                  Juliann Olesky S

## 2023-07-03 NOTE — Discharge Instructions (Signed)
   [facility]   Discharge Instructions  Endovascular Aortic Aneurysm Repair  Please refer to the following instructions for your post-procedure care. Your surgeon or Physician Assistant will discuss any changes with you.  Activity  You are encouraged to walk as much as you can. You can slowly return to normal activities but must avoid strenuous activity and heavy lifting until your doctor tells you it's OK. Avoid activities such as vacuuming or swinging a gold club. It is normal to feel tired for several weeks after your surgery. Do not drive until your doctor gives the OK and you are no longer taking prescription pain medications. It is also normal to have difficulty with sleep habits, eating, and bowel movements after surgery. These will go away with time.  Bathing/Showering  You may shower after you go home. If you have an incision, do not soak in a bathtub, hot tub, or swim until the incision heals completely.  Incision Care  Shower every day. Clean your incision with mild soap and water. Pat the area dry with a clean towel. You do not need a bandage unless otherwise instructed. Do not apply any ointments or creams to your incision. If you clothing is irritating, you may cover your incision with a dry gauze pad.  Diet  Resume your normal diet. There are no special food restrictions following this procedure. A low fat/low cholesterol diet is recommended for all patients with vascular disease. In order to heal from your surgery, it is CRITICAL to get adequate nutrition. Your body requires vitamins, minerals, and protein. Vegetables are the best source of vitamins and minerals. Vegetables also provide the perfect balance of protein. Processed food has little nutritional value, so try to avoid this.  Medications  Resume taking all of your medications unless your doctor or nurse practitioner tells you not to. If your incision is causing pain, you may take  over-the-counter pain relievers such as acetaminophen (Tylenol). If you were prescribed a stronger pain medication, please be aware these medications can cause nausea and constipation. Prevent nausea by taking the medication with a snack or meal. Avoid constipation by drinking plenty of fluids and eating foods with a high amount of fiber, such as fruits, vegetables, and grains. Do not take Tylenol if you are taking prescription pain medications.   Follow up  [facility] office will schedule a follow-up appointment with a C.T. scan 3-4 weeks after your surgery.  Please call us immediately for any of the following conditions  Severe or worsening pain in your legs or feet or in your abdomen back or chest. Increased pain, redness, drainage (pus) from your incision sit. Increased abdominal pain, bloating, nausea, vomiting or persistent diarrhea. Fever of 101 degrees or higher. Swelling in your leg (s),  Reduce your risk of vascular disease  Stop smoking. If you would like help call [facility] at 1-800-QUIT-NOW [phone]) or [place] at [phone]. Manage your cholesterol Maintain a desired weight Control your diabetes Keep your blood pressure down  If you have questions, please call the office at [phone].

## 2023-07-04 ENCOUNTER — Ambulatory Visit: Payer: HMO | Admitting: Cardiovascular Disease

## 2023-07-04 ENCOUNTER — Encounter (HOSPITAL_COMMUNITY): Payer: Self-pay | Admitting: Surgery

## 2023-07-07 ENCOUNTER — Other Ambulatory Visit: Payer: Self-pay

## 2023-07-07 DIAGNOSIS — I714 Abdominal aortic aneurysm, without rupture, unspecified: Secondary | ICD-10-CM

## 2023-07-08 ENCOUNTER — Encounter (HOSPITAL_COMMUNITY): Payer: Self-pay | Admitting: Surgery

## 2023-07-17 ENCOUNTER — Ambulatory Visit (HOSPITAL_COMMUNITY)
Admission: RE | Admit: 2023-07-17 | Discharge: 2023-07-17 | Disposition: A | Discharge status: Home / Self Care | Source: Ambulatory Visit | Attending: Surgery | Admitting: Surgery

## 2023-07-17 DIAGNOSIS — I7143 Infrarenal abdominal aortic aneurysm, without rupture: Secondary | ICD-10-CM | POA: Diagnosis not present

## 2023-07-17 DIAGNOSIS — Q600 Renal agenesis, unilateral: Secondary | ICD-10-CM | POA: Diagnosis not present

## 2023-07-17 DIAGNOSIS — R590 Localized enlarged lymph nodes: Secondary | ICD-10-CM | POA: Diagnosis not present

## 2023-07-17 DIAGNOSIS — I701 Atherosclerosis of renal artery: Secondary | ICD-10-CM | POA: Diagnosis not present

## 2023-07-17 DIAGNOSIS — I714 Abdominal aortic aneurysm, without rupture, unspecified: Secondary | ICD-10-CM

## 2023-07-17 MED ORDER — IOHEXOL 350 MG/ML SOLN
75.0000 mL | Freq: Once | INTRAVENOUS | Status: AC | PRN
  Administered 2023-07-17: 75 mL via INTRAVENOUS

## 2023-07-28 ENCOUNTER — Ambulatory Visit (INDEPENDENT_AMBULATORY_CARE_PROVIDER_SITE_OTHER): Admitting: Surgery

## 2023-07-28 ENCOUNTER — Encounter: Payer: Self-pay | Admitting: Surgery

## 2023-07-28 VITALS — BP 119/80 | HR 82 | Temp 98.0°F | Resp 20 | Ht 69.0 in | Wt 175.0 lb

## 2023-07-28 DIAGNOSIS — I7143 Infrarenal abdominal aortic aneurysm, without rupture: Secondary | ICD-10-CM

## 2023-07-28 DIAGNOSIS — I6521 Occlusion and stenosis of right carotid artery: Secondary | ICD-10-CM

## 2023-07-28 NOTE — Progress Notes (Signed)
 Patient name: [name] MRN: [id number] DOB: Sep 07, 1945 Sex: male  REASON FOR VISIT:     Post op  HISTORY OF PRESENT ILLNESS:   [name] is a 78 y.o. male who returns today for follow-up of his abdominal aortic aneurysm.  He is status post endovascular repair of a 5.3 cm aneurysm on 07/02/2023.  He did require right femoral endarterectomy with Dacron patch for failure of percutaneous access on the right.  He is complaining of some soreness in the right groin.   The patient is a current smoker.  He suffers from coronary artery disease, status post NSTEMI in 2010 treated with PCI.  He has a normal ejection fraction.  He takes a statin for hypercholesterolemia.  He is medically managed for hypertension with an ARB.  CURRENT MEDICATIONS:    Current Outpatient Medications  Medication Sig Dispense Refill   acetaminophen (TYLENOL) 650 MG CR tablet Take 650-1,300 mg by mouth every 8 (eight) hours as needed for pain.     aspirin EC 81 MG tablet Take 81 mg by mouth daily.     atorvastatin (LIPITOR) 40 MG tablet Take 40 mg by mouth in the morning.     docusate sodium (COLACE) 100 MG capsule Take 1 capsule (100 mg total) by mouth 2 (two) times daily. (Patient taking differently: Take 100 mg by mouth in the morning.) 60 capsule 1   ezetimibe (ZETIA) 10 MG tablet Take 10 mg by mouth in the morning.     HYDROcodone-acetaminophen (NORCO/VICODIN) 5-325 MG tablet Take 1 tablet by mouth every 6 (six) hours as needed. 20 tablet 0   ibuprofen (ADVIL) 200 MG tablet Take 600 mg by mouth every 8 (eight) hours as needed (pain.).     ibuprofen (ADVIL) 800 MG tablet Take 800 mg by mouth 3 (three) times daily as needed.     Menthol, Topical Analgesic, (ICY HOT EX) Apply 1 application topically daily as needed (pain).     metoprolol succinate (TOPROL-XL) 25 MG 24 hr tablet TAKE 1 TABLET BY MOUTH ONCE DAILY. 90 tablet 3   nitroGLYCERIN (NITROSTAT) 0.4 MG SL tablet Place 0.4 mg  under the tongue every 5 (five) minutes x 3 doses as needed (if no relief after 3rd dose, proceed to ED or call 911).     Omega-3 Fatty Acids (FISH OIL PO) Take 1,200 mg by mouth daily.     telmisartan (MICARDIS) 40 MG tablet Take 20 mg by mouth in the morning.     No current facility-administered medications for this visit.    REVIEW OF SYSTEMS:   [X]  denotes positive finding, [ ]  denotes negative finding Cardiac  Comments:  Chest pain or chest pressure:    Shortness of breath upon exertion:    Short of breath when lying flat:    Irregular heart rhythm:    Constitutional    Fever or chills:      PHYSICAL EXAM:   Vitals:   07/28/23 1501  BP: 119/80  Pulse: 82  Resp: 20  Temp: 98 F (36.7 C)  SpO2: 96%  Weight: 175 lb (79.4 kg)  Height: 5\' 9"  (1.753 m)    GENERAL: The patient is a well-nourished male, in no acute distress. The vital signs are documented above. CARDIOVASCULAR: There is a regular rate and rhythm. PULMONARY: Non-labored respirations Palpable pedal pulses, right DP greater than left Incisions are healing nicely  STUDIES:   CTA:  VASCULAR   1. Interval surgical changes of right of femoral  cutdown and common femoral endarterectomy as well as successful endovascular repair of the infrarenal abdominal aortic aneurysm. No evidence of endoleak or access vessel complication. Significant tortuosity of the aorta at the proximal landing zone is noted but the proximal landing zone appears well sealed and there is no evidence of complication at this time. Recommend attention on routine follow-up imaging. 2. 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 3. Moderate stenosis of the proximal left renal artery.   NON-VASCULAR   1. No acute abnormality within the abdomen or pelvis. 2. Ancillary findings as above  MEDICAL ISSUES:   AAA: Successful endovascular repair without endoleak.  Will repeat ultrasound in 6 months   Carotid: KnownI  60 to 79% right  carotid stenosis.  Will repeat ultrasound in 6 months  Charlena Cross, MD, FACS [facility] [phone] Pager [phone]

## 2023-07-30 ENCOUNTER — Other Ambulatory Visit: Payer: Self-pay

## 2023-07-30 DIAGNOSIS — I714 Abdominal aortic aneurysm, without rupture, unspecified: Secondary | ICD-10-CM

## 2023-07-30 DIAGNOSIS — I6529 Occlusion and stenosis of unspecified carotid artery: Secondary | ICD-10-CM

## 2023-08-08 ENCOUNTER — Telehealth: Payer: Self-pay

## 2023-08-08 NOTE — Progress Notes (Signed)
   08/08/2023  Patient ID: [name], male   DOB: 11/29/1945, 78 y.o.   MRN: [id number]   Patient appeared on insurance report for not passing the quality metrics in 2024:  Medication Adherence for Hypertension [facility])   Outreach to the patient was Unsuccessful. Left voicemail to return call.   Meds Tracking:  -Telmisartan 40 mg - Last fill 90 DS on 04/04/23 per pharmacy no fills yet this year. They have valsartan  prescription on hold and pharmacy said there were no refills remaining on telmisartan. BP 119/80 at cardiology on 07/28/23, note suggests he may be taking 1/2 tablet instead of a whole tablet of telmisartan. Next fill overdue unless patient taking differently than prescribed.  -Atorvastatin  40 mg - Last fill 90DS on 01/28/23, need to verify with pharmacy if this is truly the last fill, conflicting information from [facility] and [facility] fill history. Should have refills at the pharmacy. LDL 83 on 03/31/23.   Plan:  Need to confirm with patient what he is taking and how he is taking it. Conflicting information from specialist notes, payor claim information, and pharmacy. Will make adjustments to meds and send in refills after getting in contact with patient.  Flint Hummer, PharmD

## 2023-08-11 ENCOUNTER — Telehealth: Payer: Self-pay

## 2023-08-11 NOTE — Progress Notes (Addendum)
   08/11/2023  Patient ID: Arnaldo Lang, male   DOB: 04/03/1946, 78 y.o.   MRN: [id number]   Patient appeared on insurance report for not passing the quality metrics in 2024:  Medication Adherence for Hypertension [facility])   Outreach to the patient was successful but he wasn't home to confirm meds or supply. Tried again this afternoon but outreach was unsuccessful.  Meds Tracking:  -Telmisartan 40 mg - Last fill 90 DS on 04/04/23 per pharmacy no fills yet this year. They have valsartan  prescription on hold and pharmacy said there were no refills remaining on telmisartan. BP 119/80 at cardiology on 07/28/23, note suggests he may be taking 1/2 tablet instead of a whole tablet of telmisartan. Next fill overdue unless patient taking differently than prescribed.  -Atorvastatin  40 mg - Last fill 90DS on 01/28/23, need to verify with pharmacy if this is truly the last fill, conflicting information from [facility] and [facility] fill history. Should have refills at the pharmacy. LDL 83 on 03/31/23.   Plan:  Will try again tomorrow to confirm what patient has at home. Need to confirm telimisartan vs. valsaratn and how he is taking this medication. Will also need to confirm supply of atorvastatin .  Update:  Did not answer call attempts on 4/23 or 4/24. Answered call on 4/25 but was not home and did not have medications on hand to confirm the necessary information. Provided him my direct phone number and he will call me back early next week when he is home with his meds.  Flint Hummer, PharmD

## 2023-08-22 ENCOUNTER — Telehealth: Payer: Self-pay

## 2023-08-22 NOTE — Progress Notes (Signed)
   08/22/2023  Patient ID: [name], male   DOB: 07/26/1945, 78 y.o.   MRN: [id number]   Patient appeared on insurance report for not passing the quality metrics in 2024:  Medication Adherence for Hypertension [facility])   Outreach to the patient was successful.  Meds Tracking:  -Telmisartan 40 mg - Last fill 90 DS on 04/04/23 per pharmacy. They have valsartan  prescription on hold and pharmacy said there were no refills remaining on telmisartan. BP 119/80 at cardiology on 07/28/23, note suggests he may be taking 1/2 tablet instead of a whole tablet of telmisartan. Confirmed he is taking 1/2 tablet of telmisartan. Should have adequate supply through the month of May.   -Atorvastatin  40 mg - Last fill 90DS on 06/18/23, verified with pharmacy that this is the correct last fill, LDL 83 on 03/31/23. Patient does not qualify for metric yet this year, next fill due 09/16/23.  Plan:  Reconciled medications, patient taking telmisartan 40 mg - 1/2 tablet daily, still has supply through May. BP controlled at cardiology and he reported a home BP of 110/72 this week. Discussed switching telmisartan to 20 mg so he can take 1 tablet daily but he'd prefer to keep everything the same. Sent new rx for telmisartan 40 mg with correct directions of 1/2 tablet daily, collaborated with pharmacy to d/c any old telmisartan and valsartan  prescriptions. All future adherence documentation will be in [facility].   Flint Hummer, PharmD

## 2023-09-23 ENCOUNTER — Telehealth: Payer: Self-pay

## 2023-09-23 NOTE — Progress Notes (Signed)
   09/23/2023  Patient ID: [name], male   DOB: [birth year], 78 y.o.   MRN: [id number]  Adherence Monitoring:  Up to date on meds, documentation in [facility].   Flint Hummer, PharmD

## 2023-09-29 DIAGNOSIS — E782 Mixed hyperlipidemia: Secondary | ICD-10-CM | POA: Diagnosis not present

## 2023-09-29 DIAGNOSIS — Z125 Encounter for screening for malignant neoplasm of prostate: Secondary | ICD-10-CM | POA: Diagnosis not present

## 2023-09-29 DIAGNOSIS — R7303 Prediabetes: Secondary | ICD-10-CM | POA: Diagnosis not present

## 2023-09-30 LAB — LAB REPORT - SCANNED
A1c: 5.8
Albumin, Urine POC: 3.3
Creatinine, POC: 37.2 mg/dL
EGFR: 82
Microalb Creat Ratio: 9

## 2023-10-08 DIAGNOSIS — M25551 Pain in right hip: Secondary | ICD-10-CM | POA: Diagnosis not present

## 2023-10-08 DIAGNOSIS — F17200 Nicotine dependence, unspecified, uncomplicated: Secondary | ICD-10-CM | POA: Diagnosis not present

## 2023-10-08 DIAGNOSIS — M545 Low back pain, unspecified: Secondary | ICD-10-CM | POA: Diagnosis not present

## 2023-10-08 DIAGNOSIS — I10 Essential (primary) hypertension: Secondary | ICD-10-CM | POA: Diagnosis not present

## 2023-10-08 DIAGNOSIS — E875 Hyperkalemia: Secondary | ICD-10-CM | POA: Diagnosis not present

## 2023-10-08 DIAGNOSIS — I714 Abdominal aortic aneurysm, without rupture, unspecified: Secondary | ICD-10-CM | POA: Diagnosis not present

## 2023-10-08 DIAGNOSIS — M25552 Pain in left hip: Secondary | ICD-10-CM | POA: Diagnosis not present

## 2023-10-08 DIAGNOSIS — E782 Mixed hyperlipidemia: Secondary | ICD-10-CM | POA: Diagnosis not present

## 2023-10-08 DIAGNOSIS — I2510 Atherosclerotic heart disease of native coronary artery without angina pectoris: Secondary | ICD-10-CM | POA: Diagnosis not present

## 2023-10-08 DIAGNOSIS — R7303 Prediabetes: Secondary | ICD-10-CM | POA: Diagnosis not present

## 2023-10-08 DIAGNOSIS — I6521 Occlusion and stenosis of right carotid artery: Secondary | ICD-10-CM | POA: Diagnosis not present

## 2023-10-08 DIAGNOSIS — F17210 Nicotine dependence, cigarettes, uncomplicated: Secondary | ICD-10-CM | POA: Diagnosis not present

## 2023-10-09 ENCOUNTER — Telehealth: Payer: Self-pay

## 2023-10-09 NOTE — Telephone Encounter (Signed)
 Unsuccessful outreach to confirm dosing with patient. PCP changed directions again but patient hasn't been taking a full tablet. Will reach out again next week if no return call.

## 2023-10-16 ENCOUNTER — Telehealth: Payer: Self-pay

## 2023-10-16 NOTE — Telephone Encounter (Signed)
 Up to date on meds, next review in July

## 2023-11-11 ENCOUNTER — Telehealth: Payer: Self-pay

## 2023-11-11 NOTE — Telephone Encounter (Signed)
 Up to date on meds, next review in September

## 2023-12-10 DIAGNOSIS — L570 Actinic keratosis: Secondary | ICD-10-CM | POA: Diagnosis not present

## 2023-12-10 DIAGNOSIS — X32XXXA Exposure to sunlight, initial encounter: Secondary | ICD-10-CM | POA: Diagnosis not present

## 2023-12-10 DIAGNOSIS — C44612 Basal cell carcinoma of skin of right upper limb, including shoulder: Secondary | ICD-10-CM | POA: Diagnosis not present

## 2024-01-02 ENCOUNTER — Encounter (INDEPENDENT_AMBULATORY_CARE_PROVIDER_SITE_OTHER): Payer: Self-pay | Admitting: *Deleted

## 2024-01-21 DIAGNOSIS — Z85828 Personal history of other malignant neoplasm of skin: Secondary | ICD-10-CM | POA: Diagnosis not present

## 2024-01-21 DIAGNOSIS — B078 Other viral warts: Secondary | ICD-10-CM | POA: Diagnosis not present

## 2024-01-21 DIAGNOSIS — C44519 Basal cell carcinoma of skin of other part of trunk: Secondary | ICD-10-CM | POA: Diagnosis not present

## 2024-01-21 DIAGNOSIS — D045 Carcinoma in situ of skin of trunk: Secondary | ICD-10-CM | POA: Diagnosis not present

## 2024-01-21 DIAGNOSIS — Z08 Encounter for follow-up examination after completed treatment for malignant neoplasm: Secondary | ICD-10-CM | POA: Diagnosis not present

## 2024-01-21 DIAGNOSIS — D225 Melanocytic nevi of trunk: Secondary | ICD-10-CM | POA: Diagnosis not present

## 2024-01-26 ENCOUNTER — Ambulatory Visit (HOSPITAL_BASED_OUTPATIENT_CLINIC_OR_DEPARTMENT_OTHER)
Admission: RE | Admit: 2024-01-26 | Discharge: 2024-01-26 | Disposition: A | Discharge status: Home / Self Care | Source: Ambulatory Visit | Attending: Surgery | Admitting: Surgery

## 2024-01-26 ENCOUNTER — Ambulatory Visit (HOSPITAL_COMMUNITY)
Admission: RE | Admit: 2024-01-26 | Discharge: 2024-01-26 | Disposition: A | Discharge status: Home / Self Care | Source: Ambulatory Visit | Attending: Surgery | Admitting: Surgery

## 2024-01-26 DIAGNOSIS — I6529 Occlusion and stenosis of unspecified carotid artery: Secondary | ICD-10-CM | POA: Diagnosis not present

## 2024-01-26 DIAGNOSIS — I714 Abdominal aortic aneurysm, without rupture, unspecified: Secondary | ICD-10-CM | POA: Diagnosis not present

## 2024-02-02 ENCOUNTER — Encounter: Payer: Self-pay | Admitting: Surgery

## 2024-02-02 ENCOUNTER — Ambulatory Visit: Attending: Surgery | Admitting: Surgery

## 2024-02-02 VITALS — BP 112/76 | HR 74 | Temp 98.5°F | Ht 69.0 in | Wt 177.0 lb

## 2024-02-02 DIAGNOSIS — I6523 Occlusion and stenosis of bilateral carotid arteries: Secondary | ICD-10-CM | POA: Diagnosis not present

## 2024-02-02 DIAGNOSIS — I7143 Infrarenal abdominal aortic aneurysm, without rupture: Secondary | ICD-10-CM

## 2024-02-02 NOTE — Progress Notes (Signed)
 [facility]  Patient name: [name] MRN: [id number] DOB: 1946/01/31 Sex: male   REASON FOR VISIT:    Follow up  HISOTRY OF PRESENT ILLNESS:     [name] is a 78 y.o. male who returns today for follow-up of his abdominal aortic aneurysm.  He is status post endovascular repair of a 5.3 cm aneurysm on 07/02/2023.  He did require right femoral endarterectomy with Dacron patch for failure of percutaneous access on the right.  Follow-up CT scan showed no evidence of complication.  He is for follow-up.  He has known carotid disease as well with a 60 to 79% right sided asymptomatic stenosis.  He has stopped smoking!   The patient is a current smoker.  He suffers from coronary artery disease, status post NSTEMI in 2010 treated with PCI.  He has a normal ejection fraction.  He takes a statin for hypercholesterolemia.  He is medically managed for hypertension with an ARB.  PAST MEDICAL HISTORY:   Past Medical History:  Diagnosis Date   Arthritis    CAD, NATIVE VESSEL 12/29/2008   Qualifier: Diagnosis of  By: Verlin, MD, Christopher     CHEST PAIN-UNSPECIFIED 12/24/2008   Qualifier: Diagnosis of  By: Wynetta CMA, Carol     Coronary artery disease    DYSLIPIDEMIA 12/24/2008   Qualifier: Diagnosis of  By: Wynetta CMA, Carol     Hyperlipidemia    Hypertension    MI (myocardial infarction) (HCC)    TOBACCO USER 12/24/2008   Qualifier: Diagnosis of  By: Wynetta, CMA, Carol       FAMILY HISTORY:   Family History  Problem Relation Age of Onset   Alzheimer's disease Mother    Heart attack Father     SOCIAL HISTORY:   Social History   Tobacco Use   Smoking status: Former    Current packs/day: 0.00    Average packs/day: 0.3 packs/day for 40.0 years (10.0 ttl pk-yrs)    Types: Cigarettes    Quit date: 01/27/2024    Years since quitting: 0.0   Smokeless tobacco: Never  Substance Use Topics   Alcohol use: Yes     Alcohol/week: 1.0 standard drink of alcohol    Types: 1 Cans of beer per week     ALLERGIES:   No Known Allergies   CURRENT MEDICATIONS:   Current Outpatient Medications  Medication Sig Dispense Refill   acetaminophen  (TYLENOL ) 650 MG CR tablet Take 650-1,300 mg by mouth every 8 (eight) hours as needed for pain.     aspirin  EC 81 MG tablet Take 81 mg by mouth daily.     atorvastatin  (LIPITOR) 40 MG tablet Take 40 mg by mouth in the morning.     docusate sodium  (COLACE) 100 MG capsule Take 1 capsule (100 mg total) by mouth 2 (two) times daily. (Patient taking differently: Take 100 mg by mouth in the morning.) 60 capsule 1   ezetimibe  (ZETIA ) 10 MG tablet Take 10 mg by mouth in the morning.     HYDROcodone -acetaminophen  (NORCO/VICODIN) 5-325 MG tablet Take 1 tablet by mouth every 6 (six) hours as needed. 20 tablet 0   ibuprofen  (ADVIL ) 200 MG tablet Take 600 mg by mouth every 8 (eight) hours as needed (pain.).     ibuprofen  (ADVIL ) 800 MG tablet Take 800 mg by mouth 3 (three) times daily as needed.     Menthol, Topical Analgesic, (ICY HOT EX) Apply 1 application topically daily as needed (  pain).     metoprolol  succinate (TOPROL -XL) 25 MG 24 hr tablet TAKE 1 TABLET BY MOUTH ONCE DAILY. 90 tablet 3   nitroGLYCERIN  (NITROSTAT ) 0.4 MG SL tablet Place 0.4 mg under the tongue every 5 (five) minutes x 3 doses as needed (if no relief after 3rd dose, proceed to ED or call 911).     Omega-3 Fatty Acids (FISH OIL PO) Take 1,200 mg by mouth daily.     telmisartan (MICARDIS) 40 MG tablet Take 20 mg by mouth in the morning.     No current facility-administered medications for this visit.    REVIEW OF SYSTEMS:   [X]  denotes positive finding, [ ]  denotes negative finding Cardiac  Comments:  Chest pain or chest pressure:    Shortness of breath upon exertion:    Short of breath when lying flat:    Irregular heart rhythm:        Vascular    Pain in calf, thigh, or hip brought on by ambulation:     Pain in feet at night that wakes you up from your sleep:     Blood clot in your veins:    Leg swelling:         Pulmonary    Oxygen at home:    Productive cough:     Wheezing:         Neurologic    Sudden weakness in arms or legs:     Sudden numbness in arms or legs:     Sudden onset of difficulty speaking or slurred speech:    Temporary loss of vision in one eye:     Problems with dizziness:         Gastrointestinal    Blood in stool:     Vomited blood:         Genitourinary    Burning when urinating:     Blood in urine:        Psychiatric    Major depression:         Hematologic    Bleeding problems:    Problems with blood clotting too easily:        Skin    Rashes or ulcers:        Constitutional    Fever or chills:      PHYSICAL EXAM:   Vitals:   02/02/24 1045 02/02/24 1048  BP: 110/70 112/76  Pulse: 74   Temp: 98.5 F (36.9 C)   SpO2: 98%   Weight: 177 lb (80.3 kg)   Height: 5' 9 (1.753 m)     GENERAL: The patient is a well-nourished male, in no acute distress. The vital signs are documented above. CARDIAC: There is a regular rate and rhythm.  VASCULAR: Palpable femoral pulses PULMONARY: Non-labored respirations ABDOMEN: Soft and non-tender, however there is some tenderness in the right lower quadrant MUSCULOSKELETAL: There are no major deformities or cyanosis. NEUROLOGIC: No focal weakness or paresthesias are detected. SKIN: There are no ulcers or rashes noted. PSYCHIATRIC: The patient has a normal affect.  STUDIES:   I have reviewed the following: Carotid: Right Carotid: Velocities in the right ICA are consistent with a 60-79%                 stenosis.   Left Carotid: Velocities in the left ICA are consistent with a 1-39%  stenosis.   Vertebrals: Bilateral vertebral arteries demonstrate antegrade flow.  Subclavians: Normal flow hemodynamics were seen in bilateral subclavian  arteries.     Aorta: Endovascular Aortic  Repair (EVAR):  +----------+----------------+-------------------+-------------------+           Diameter AP (cm)Diameter Trans (cm)Velocities (cm/sec)  +----------+----------------+-------------------+-------------------+  Aorta    5.20            5.10               53                   +----------+----------------+-------------------+-------------------+  Right Limb1.50            1.60               34                   +----------+----------------+-------------------+-------------------+  Left Limb 1.50            1.60               34                   +----------+----------------+-------------------+-------------------+        Summary:  Abdominal Aorta: The largest aortic measurement is 5.2 cm. Patent  endovascular aneurysm repair with no evidence of endoleak.      MEDICAL ISSUES:   AAA: Successful exclusion of aneurysm.  There is tortuosity at the aortic neck which need to be monitored.  There is no evidence of endoleak.  Maximum diameter has gone from 5.3 cm at the time of surgery down to 5.2.  He will get a repeat ultrasound in 6 months  Carotid: He remains asymptomatic.  Stenosis is stable on the right at 60 to 79%.  He will get a repeat study in 6 months  He was congratulated on smoking cessation    Malvina Serene CLORE, MD, FACS [facility] [phone] Pager [phone]

## 2024-02-05 ENCOUNTER — Other Ambulatory Visit: Payer: Self-pay | Admitting: *Deleted

## 2024-02-05 DIAGNOSIS — Z8679 Personal history of other diseases of the circulatory system: Secondary | ICD-10-CM

## 2024-02-05 DIAGNOSIS — I6523 Occlusion and stenosis of bilateral carotid arteries: Secondary | ICD-10-CM

## 2024-02-09 ENCOUNTER — Other Ambulatory Visit (HOSPITAL_COMMUNITY)

## 2024-02-09 ENCOUNTER — Ambulatory Visit: Admitting: Surgery

## 2024-02-09 ENCOUNTER — Encounter (HOSPITAL_COMMUNITY)

## 2024-03-08 DIAGNOSIS — L570 Actinic keratosis: Secondary | ICD-10-CM | POA: Diagnosis not present

## 2024-03-08 DIAGNOSIS — Z08 Encounter for follow-up examination after completed treatment for malignant neoplasm: Secondary | ICD-10-CM | POA: Diagnosis not present

## 2024-03-08 DIAGNOSIS — X32XXXD Exposure to sunlight, subsequent encounter: Secondary | ICD-10-CM | POA: Diagnosis not present

## 2024-03-08 DIAGNOSIS — Z85828 Personal history of other malignant neoplasm of skin: Secondary | ICD-10-CM | POA: Diagnosis not present

## 2024-04-03 LAB — LAB REPORT - SCANNED
A1c: 6
Albumin, Urine POC: 16.6
Albumin/Creatinine Ratio, Urine, POC: 14
Creatinine, POC: 118.1 mg/dL
EGFR: 75

## 2024-04-08 ENCOUNTER — Other Ambulatory Visit (HOSPITAL_COMMUNITY): Payer: Self-pay | Admitting: Internal Medicine

## 2024-04-08 DIAGNOSIS — F17200 Nicotine dependence, unspecified, uncomplicated: Secondary | ICD-10-CM

## 2024-08-02 ENCOUNTER — Ambulatory Visit

## 2024-08-02 ENCOUNTER — Ambulatory Visit (HOSPITAL_COMMUNITY)
